# Patient Record
Sex: Female | Born: 1955
Health system: Southern US, Community
[De-identification: ages and names within clinical notes are randomized; demographics above are authoritative.]

## PROBLEM LIST (undated history)

## (undated) DIAGNOSIS — F419 Anxiety disorder, unspecified: Secondary | ICD-10-CM

## (undated) DIAGNOSIS — F32A Depression, unspecified: Secondary | ICD-10-CM

## (undated) DIAGNOSIS — F329 Major depressive disorder, single episode, unspecified: Secondary | ICD-10-CM

## (undated) HISTORY — DX: Major depressive disorder, single episode, unspecified: F32.9

## (undated) HISTORY — DX: Depression, unspecified: F32.A

## (undated) HISTORY — PX: ABDOMINAL HYSTERECTOMY: SHX81

## (undated) HISTORY — PX: TONSILLECTOMY: SUR1361

## (undated) HISTORY — DX: Anxiety disorder, unspecified: F41.9

---

## 2012-07-01 DIAGNOSIS — R350 Frequency of micturition: Secondary | ICD-10-CM | POA: Insufficient documentation

## 2014-05-23 DIAGNOSIS — F1010 Alcohol abuse, uncomplicated: Secondary | ICD-10-CM | POA: Insufficient documentation

## 2014-06-23 ENCOUNTER — Encounter (HOSPITAL_COMMUNITY): Payer: Self-pay | Admitting: Physician Assistant

## 2014-06-23 ENCOUNTER — Encounter (INDEPENDENT_AMBULATORY_CARE_PROVIDER_SITE_OTHER): Payer: Self-pay

## 2014-06-23 ENCOUNTER — Ambulatory Visit (INDEPENDENT_AMBULATORY_CARE_PROVIDER_SITE_OTHER): Payer: BC Managed Care – PPO | Admitting: Physician Assistant

## 2014-06-23 VITALS — BP 159/77 | HR 75 | Ht 65.0 in | Wt 150.0 lb

## 2014-06-23 DIAGNOSIS — M545 Low back pain, unspecified: Secondary | ICD-10-CM | POA: Insufficient documentation

## 2014-06-23 DIAGNOSIS — J309 Allergic rhinitis, unspecified: Secondary | ICD-10-CM | POA: Insufficient documentation

## 2014-06-23 DIAGNOSIS — R945 Abnormal results of liver function studies: Secondary | ICD-10-CM | POA: Insufficient documentation

## 2014-06-23 DIAGNOSIS — I10 Essential (primary) hypertension: Secondary | ICD-10-CM | POA: Insufficient documentation

## 2014-06-23 DIAGNOSIS — E785 Hyperlipidemia, unspecified: Secondary | ICD-10-CM | POA: Insufficient documentation

## 2014-06-23 DIAGNOSIS — F1024 Alcohol dependence with alcohol-induced mood disorder: Secondary | ICD-10-CM

## 2014-06-23 DIAGNOSIS — M5136 Other intervertebral disc degeneration, lumbar region: Secondary | ICD-10-CM | POA: Insufficient documentation

## 2014-06-23 DIAGNOSIS — F419 Anxiety disorder, unspecified: Secondary | ICD-10-CM | POA: Insufficient documentation

## 2014-06-23 DIAGNOSIS — M419 Scoliosis, unspecified: Secondary | ICD-10-CM | POA: Insufficient documentation

## 2014-06-23 DIAGNOSIS — F10239 Alcohol dependence with withdrawal, unspecified: Secondary | ICD-10-CM

## 2014-06-23 DIAGNOSIS — R7989 Other specified abnormal findings of blood chemistry: Secondary | ICD-10-CM | POA: Insufficient documentation

## 2014-06-23 NOTE — Progress Notes (Signed)
Psychiatric Assessment Adult  Patient Identification:  Laren BoomLeslie Ellen Wisniewski Date of Evaluation:  06/23/2014 Chief Complaint: Alcohol abuse History of Chief Complaint:   Chief Complaint  Patient presents with  . Establish Care    HPI Comments: The patient is a 58 year old MWF who [facility] evaluation and treatment of her alcohol abuse by her PCP Dr. Record. In October of this year the patient had drunk 1/5 of rum over a weekend and was found unresponsive by her husband who sent her to the ED where she was found to have a BAL of 404. She was treated with fluids recovered and sent home. She returned to the ED the next day due to tremors and a telepsych was done and her BAL was at 10. She was told to drink 2 beers or glasses of wine per day since then to avoid recurrence of symptoms.   She has been drinking daily for 2 years with her longest period of abstinence in that time was 1 week. She has 2 drinks at night after work, and 3-4 on the weekends.  She has had no previous hx of alcohol or substance abuse.  Her father was an alcoholic and is now deceased. Both of her parents are deceased. She has no previous history of psychiatric issues but is currently on Xanax and Zoloft for her anxiety.  She denies any current symptoms of depression but notes that prior to her recent ED visits she has been thinking more about her deceased parents and feels a lot of guilt about them. She reports her symptoms of depression increased over the past 6-8 months as her drinking increased.  She denies SI/HI or AVH.        Review of Systems Physical Exam  Depressive Symptoms: anhedonia prior to ED  (Hypo) Manic Symptoms:   Elevated Mood:  No Irritable Mood:  No Grandiosity:  No Distractibility:  No Labiality of Mood:  No Delusions:  No Hallucinations:  No Impulsivity:  No Sexually Inappropriate Behavior:  No Financial Extravagance:  No Flight of Ideas:  No  Anxiety  Symptoms: Excessive Worry:  No Panic Symptoms:  No Agoraphobia:  No Obsessive Compulsive: No  Symptoms: None, Specific Phobias:  No Social Anxiety:  No  Psychotic Symptoms:  Hallucinations: No  Delusions:  No Paranoia:  No   Ideas of Reference:  No  PTSD Symptoms: Ever had a traumatic exposure:  No Had a traumatic exposure in the last month:  No Re-experiencing:   Hypervigilance:   Hyperarousal:  Avoidance:    Traumatic Brain Injury: No   Past Psychiatric History: Diagnosis: none  Hospitalizations: none  Outpatient Care: none  Substance Abuse Care: none  Self-Mutilation: none  Suicidal Attempts: none  Violent Behaviors: none   Past Medical History:   Past Medical History  Diagnosis Date  . Anxiety   . Depression    History of Loss of Consciousness:  No Seizure History:  No Cardiac History:  No Allergies:   Allergies  Allergen Reactions  . Sulfa Antibiotics    Current Medications:  Current Outpatient Prescriptions  Medication Sig Dispense Refill  . ALPRAZolam (XANAX) 0.5 MG tablet Take 0.5 mg by mouth.    Marland Kitchen. atorvastatin (LIPITOR) 40 MG tablet TAKE 1/2 TO 1 TABLET DAILY FOR CHOLESTROL    . carvedilol (COREG) 12.5 MG tablet TAKE 1 TABLET BY MOUTH TWICE A DAY FOR BLOOD PRESSURE.    Marland Kitchen. estradiol (VIVELLE-DOT) 0.05 MG/24HR patch Place 1 patch onto  the skin.    . fluticasone (FLONASE) 50 MCG/ACT nasal spray 1 spray in each nostril daily    . HYDROcodone-acetaminophen (NORCO/VICODIN) 5-325 MG per tablet Take 1 tablet by mouth.    Marland Kitchen. lisinopril-hydrochlorothiazide (PRINZIDE,ZESTORETIC) 20-25 MG per tablet TAKE 1 TABLET BY MOUTH EVERY MORNING FOR BLOOD PRESSURE.    . naproxen (NAPROSYN) 375 MG tablet TAKE 1 TABLET (375 MG TOTAL) BY MOUTH 2 (TWO) TIMES DAILY AS NEEDED (BACK PAIN.). TAKE WITH FOOD.    Marland Kitchen. sertraline (ZOLOFT) 100 MG tablet TAKE 1 TABLET EVERY DAY    . montelukast (SINGULAIR) 10 MG tablet TAKE 1 TABLET (10 MG TOTAL) BY MOUTH DAILY. FOR COUGH AND CONGESTION      No current facility-administered medications for this visit.    Previous Psychotropic Medications:  Medication Dose   zoloft  100   xanax                   Substance Abuse History in the last 12 months:  See HPI  Medical Consequences of Substance Abuse: See HPI  Legal Consequences of Substance Abuse: none  Family Consequences of Substance Abuse: alienation of family  Blackouts:  No DT's:  Yes Withdrawal Symptoms:  Yes Cramps Headaches Nausea Tremors  Social History: [facility] [place] Place [place] Birth[place] Family Members: Husband 2 sons, parents and 1 brother deceased, 1 brother alive Marital Status:  Married Children: 2  Sons: 2  Daughters:  Relationships:  Education:  HS Print production plannerGraduate Educational Problems/Performance:  Religious Beliefs/Practices: chrisitian History of Abuse: none Teacher, musicccupational Experiences; Military History:  None. Legal History: None Hobbies/Interests:   Family History:   Family History  Problem Relation Age of Onset  . Alcohol abuse Father   . Schizophrenia Paternal Uncle     Mental Status Examination/Evaluation: Objective:  Appearance: Well Groomed  Eye Contact::  Good  Speech:  Clear and Coherent  Volume:  Normal  Mood:  euthymic  Affect:  Congruent congruent and motivated  Thought Process:  Goal Directed  Orientation:  Full (Time, Place, and Person)  Thought Content:  WDL  Suicidal Thoughts:  No  Homicidal Thoughts:  No  Judgement:  Good  Insight:  Present  Psychomotor Activity:  Normal  Akathisia:  No  Handed:  Right  AIMS (if indicated):    Assets:  Communication Skills Desire for Improvement Financial Resources/Insurance Housing Leisure Time Physical Health Resilience Social Support Talents/Skills Transportation Vocational/Educational    Laboratory/X-Ray Psychological Evaluation(s)        Assessment:  Substance induced mood disorder, Alcohol dependence in early  withdrawal.  AXIS I  SIMDO, Alcohol dependence early withdrawal  AXIS II Deferred  AXIS III Past Medical History  Diagnosis Date  . Anxiety   . Depression      AXIS IV problems related to social environment  AXIS V 61-70 mild symptoms   Treatment Plan/Recommendations:  Plan of Care:  1. Discussed options with patient which included AA meetings, [facility]. (Patient's preference)  Laboratory:  None at this time  Psychotherapy: as needed  Medications: continue as written  Routine PRN Medications:  No  Consultations: as needed  Safety Concerns:  Patient is advised to d/c alcohol by decreasing amount each day by 1/2 every three days to lessen her withdrawal symptoms.  She is advised to call for questions or concerns.  Should she experience any symptoms of alcohol withdrawal she is to go to the nearest ED.  We are happy to manage  her medications if she chooses or she can follow up with her PCP.   Other:      Kamiyah Kindel, PA-C 11/12/20159:25 AM

## 2018-08-27 ENCOUNTER — Ambulatory Visit (INDEPENDENT_AMBULATORY_CARE_PROVIDER_SITE_OTHER): Payer: BLUE CROSS/BLUE SHIELD | Admitting: Osteopathic Medicine

## 2018-08-27 ENCOUNTER — Encounter: Payer: Self-pay | Admitting: Osteopathic Medicine

## 2018-08-27 VITALS — BP 118/53 | HR 81 | Temp 98.2°F | Ht 65.0 in | Wt 151.9 lb

## 2018-08-27 DIAGNOSIS — F17209 Nicotine dependence, unspecified, with unspecified nicotine-induced disorders: Secondary | ICD-10-CM

## 2018-08-27 DIAGNOSIS — E782 Mixed hyperlipidemia: Secondary | ICD-10-CM | POA: Diagnosis not present

## 2018-08-27 DIAGNOSIS — I10 Essential (primary) hypertension: Secondary | ICD-10-CM

## 2018-08-27 DIAGNOSIS — R7989 Other specified abnormal findings of blood chemistry: Secondary | ICD-10-CM

## 2018-08-27 DIAGNOSIS — R945 Abnormal results of liver function studies: Secondary | ICD-10-CM

## 2018-08-27 DIAGNOSIS — Z716 Tobacco abuse counseling: Secondary | ICD-10-CM

## 2018-08-27 DIAGNOSIS — R011 Cardiac murmur, unspecified: Secondary | ICD-10-CM

## 2018-08-27 DIAGNOSIS — M5136 Other intervertebral disc degeneration, lumbar region: Secondary | ICD-10-CM

## 2018-08-27 DIAGNOSIS — F419 Anxiety disorder, unspecified: Secondary | ICD-10-CM

## 2018-08-27 DIAGNOSIS — F1011 Alcohol abuse, in remission: Secondary | ICD-10-CM | POA: Insufficient documentation

## 2018-08-27 MED ORDER — ATORVASTATIN CALCIUM 40 MG PO TABS: 40.0000 mg | ORAL_TABLET | Freq: Every day | ORAL | 3 refills | Status: DC

## 2018-08-27 MED ORDER — TRAZODONE HCL 50 MG PO TABS: 50.0000 mg | ORAL_TABLET | Freq: Every day | ORAL | 3 refills | Status: AC

## 2018-08-27 MED ORDER — GABAPENTIN 100 MG PO CAPS: 100.0000 mg | ORAL_CAPSULE | Freq: Three times a day (TID) | ORAL | 3 refills | Status: AC

## 2018-08-27 MED ORDER — LISINOPRIL 20 MG PO TABS: 20.0000 mg | ORAL_TABLET | Freq: Every day | ORAL | 3 refills | Status: DC

## 2018-08-27 MED ORDER — NALTREXONE HCL 50 MG PO TABS: 50.0000 mg | ORAL_TABLET | Freq: Every day | ORAL | 3 refills | Status: DC

## 2018-08-27 MED ORDER — SERTRALINE HCL 100 MG PO TABS: 100.0000 mg | ORAL_TABLET | Freq: Every day | ORAL | 3 refills | Status: DC

## 2018-08-27 MED ORDER — MECLIZINE HCL 25 MG PO TABS: 25.0000 mg | ORAL_TABLET | Freq: Two times a day (BID) | ORAL | 3 refills | Status: DC

## 2018-08-27 MED ORDER — CARVEDILOL 25 MG PO TABS: 25.0000 mg | ORAL_TABLET | Freq: Two times a day (BID) | ORAL | 3 refills | Status: DC

## 2018-08-27 MED ORDER — ESTRADIOL 0.05 MG/24HR TD PTTW: 1.0000 | MEDICATED_PATCH | TRANSDERMAL | 3 refills | Status: DC

## 2018-08-27 NOTE — Patient Instructions (Signed)
Plan: Lab orders in, get done fasting when due Depending on lab results, may consider ultrasound of the liver Will get ultrasound of the heart to evaluate mild murmur Have your pharmacy contact us when you need refills  Will see you back in about 6 months for regular follow-up and annual physical I'm here is you need me sooner!

## 2018-08-27 NOTE — Progress Notes (Signed)
HPI: Michelle Hughes is a 63 y.o. female who  has a past medical history of Anxiety and Depression.  she presents to [facility] today, 08/27/18,  for chief complaint of: New to establish - see headings below for details   CARDIOVASCULAR  HTN: no CP/SOB  HLD: on statin  RESPIRATORY  Per records: "Tobacco dependence:has prev been able tostop, still 1/2 ppd" confirmed w/ pt  NEUROLOGICAL/PSYCHIATRIC  Hx EtOH absuse/dependence uncontrolled for about 3-4 years total - currently in therapy and in remission though still consuming 1-2 glasses of wine per week.   Sertaline for many years  Previously on Ativan 0.5 mg po q8h prn  RENAL  Per records: Hx of hyponatremia: her sodium drops when she is intoxicated for longer periods of time. Normalizes when she stops drinking. Her sertraline (sometimes assoc with SIADH) has not changed since 2013."  URINARY  REPRODUCTIVE  Per records: "Menopause:surgical after severe endometritis and PID in her 20's. On estrogen since then, has stopped it periodically and states does not notice any difference when she does. Is using the estradiol patch per GYN."  GASTROINTESTINAL  Fatty liver, EtOH use, hx elevated liver enzymes and elevated lipase    Labs reviewed:  03/2018: CBC ok, CMP showed leevated AST to 146 and ALT to 72 about baseline, chronically elevated Lipase 99 at last check, slight elevated Ca at 10.7, Sodium ok,          Past medical, surgical, social and family history reviewed:  Patient Active Problem List   Diagnosis Date Noted  . Allergic rhinitis 06/23/2014  . Anxiety 06/23/2014  . Abnormal LFTs 06/23/2014  . HLD (hyperlipidemia) 06/23/2014  . BP (high blood pressure) 06/23/2014  . LBP (low back pain) 06/23/2014  . DDD (degenerative disc disease), lumbar 06/23/2014  . Scoliosis 06/23/2014  . AA (alcohol abuse) 05/23/2014  . FOM (frequency of micturition) 07/01/2012    No  past surgical history on file.  Social History   Tobacco Use  . Smoking status: Current Every Day Smoker    Packs/day: 0.50    Years: 40.00    Pack years: 20.00    Types: Cigarettes  . Smokeless tobacco: Never Used  Substance Use Topics  . Alcohol use: Yes    Alcohol/week: 0.0 standard drinks    Comment: 18 servings a week. Now 1-2 glasses per night    Family History  Problem Relation Age of Onset  . Alcohol abuse Father   . Schizophrenia Paternal Uncle      Current medication list and allergy/intolerance information reviewed:    Current Outpatient Medications  Medication Sig Dispense Refill  . ALPRAZolam (XANAX) 0.5 MG tablet Take 0.5 mg by mouth.    Marland Kitchen atorvastatin (LIPITOR) 40 MG tablet TAKE 1/2 TO 1 TABLET DAILY FOR CHOLESTROL    . carvedilol (COREG) 12.5 MG tablet TAKE 1 TABLET BY MOUTH TWICE A DAY FOR BLOOD PRESSURE.    Marland Kitchen estradiol (VIVELLE-DOT) 0.05 MG/24HR patch Place 1 patch onto the skin.    . fluticasone (FLONASE) 50 MCG/ACT nasal spray 1 spray in each nostril daily    . HYDROcodone-acetaminophen (NORCO/VICODIN) 5-325 MG per tablet Take 1 tablet by mouth.    Marland Kitchen lisinopril-hydrochlorothiazide (PRINZIDE,ZESTORETIC) 20-25 MG per tablet TAKE 1 TABLET BY MOUTH EVERY MORNING FOR BLOOD PRESSURE.    . montelukast (SINGULAIR) 10 MG tablet TAKE 1 TABLET (10 MG TOTAL) BY MOUTH DAILY. FOR COUGH AND CONGESTION    . naproxen (NAPROSYN) 375 MG tablet  TAKE 1 TABLET (375 MG TOTAL) BY MOUTH 2 (TWO) TIMES DAILY AS NEEDED (BACK PAIN.). TAKE WITH FOOD.    Marland Kitchen. sertraline (ZOLOFT) 100 MG tablet TAKE 1 TABLET EVERY DAY     No current facility-administered medications for this visit.     Allergies  Allergen Reactions  . Sulfa Antibiotics       Review of Systems:  Constitutional:  No  fever, no chills, No recent illness, No unintentional weight changes. No significant fatigue.   HEENT: No  headache, no vision change, no hearing change, No sore throat, No  sinus pressure  Cardiac: No   chest pain, No  pressure, No palpitations, No  Orthopnea  Respiratory:  No  shortness of breath. No  Cough  Gastrointestinal: No  abdominal pain, No  nausea, No  vomiting,  No  blood in stool, No  diarrhea, No  constipation   Musculoskeletal: No new myalgia/arthralgia  Skin: No  Rash, No other wounds/concerning lesions  Genitourinary: No  incontinence, No  abnormal genital bleeding, No abnormal genital discharge  Hem/Onc: No  easy bruising/bleeding, No  abnormal lymph node  Endocrine: No cold intolerance,  No heat intolerance. No polyuria/polydipsia/polyphagia   Neurologic: No  weakness, No  dizziness, No  slurred speech/focal weakness/facial droop  Psychiatric: No  concerns with depression, No  concerns with anxiety, No sleep problems, No mood problems  Exam:  BP (!) 118/53 (BP Location: Left Arm, Patient Position: Sitting, Cuff Size: Normal)   Pulse 81   Temp 98.2 F (36.8 C) (Oral)   Ht 5\' 5"  (1.651 m)   Wt 151 lb 14.4 oz (68.9 kg)   BMI 25.28 kg/m   Constitutional: VS see above. General Appearance: alert, well-developed, well-nourished, NAD  Eyes: Normal lids and conjunctive, non-icteric sclera  Ears, Nose, Mouth, Throat: MMM, Normal external inspection ears/nares/mouth/lips/gums. TM normal bilaterally. Pharynx/tonsils no erythema, no exudate. Nasal mucosa normal.   Neck: No masses, trachea midline. No thyroid enlargement. No tenderness/mass appreciated. No lymphadenopathy  Respiratory: Normal respiratory effort. no wheeze, no rhonchi, no rales  Cardiovascular: S1/S2 normal, +murmur, no rub/gallop auscultated. RRR. No lower extremity edema.  Gastrointestinal: Nontender, no masses. No hepatomegaly, no splenomegaly. No hernia appreciated. Bowel sounds normal. Rectal exam deferred.   Musculoskeletal: Gait normal. No clubbing/cyanosis of digits.   Neurological: Normal balance/coordination. No tremor. No cranial nerve deficit on limited exam. Motor and sensation intact  and symmetric. Cerebellar reflexes intact.   Skin: warm, dry, intact. No rash/ulcer. No concerning nevi or subq nodules on limited exam.    Psychiatric: Normal judgment/insight. Normal mood and affect. Oriented x3.        ASSESSMENT/PLAN: The primary encounter diagnosis was Abnormal LFTs. Diagnoses of Tobacco use disorder, continuous, Tobacco abuse counseling, Mixed hyperlipidemia, DDD (degenerative disc disease), lumbar, Essential hypertension, Anxiety, Alcohol abuse, in remission, and Heart murmur were also pertinent to this visit.   Orders Placed This Encounter  Procedures  . CBC  . COMPLETE METABOLIC PANEL WITH GFR  . Lipid panel  . Lipase  . TSH  . ECHOCARDIOGRAM COMPLETE    Meds ordered this encounter  Medications  . atorvastatin (LIPITOR) 40 MG tablet    Sig: Take 1 tablet (40 mg total) by mouth daily.    Dispense:  90 tablet    Refill:  3  . carvedilol (COREG) 25 MG tablet    Sig: Take 1 tablet (25 mg total) by mouth 2 (two) times daily with a meal.    Dispense:  180 tablet  Refill:  3  . estradiol (VIVELLE-DOT) 0.05 MG/24HR patch    Sig: Place 1 patch (0.05 mg total) onto the skin 2 (two) times a week.    Dispense:  24 patch    Refill:  3  . gabapentin (NEURONTIN) 100 MG capsule    Sig: Take 1 capsule (100 mg total) by mouth 3 (three) times daily.    Dispense:  270 capsule    Refill:  3  . lisinopril (PRINIVIL,ZESTRIL) 20 MG tablet    Sig: Take 1 tablet (20 mg total) by mouth daily.    Dispense:  90 tablet    Refill:  3  . meclizine (ANTIVERT) 25 MG tablet    Sig: Take 1 tablet (25 mg total) by mouth 2 (two) times daily.    Dispense:  180 tablet    Refill:  3  . naltrexone (DEPADE) 50 MG tablet    Sig: Take 1 tablet (50 mg total) by mouth daily.    Dispense:  90 tablet    Refill:  3  . sertraline (ZOLOFT) 100 MG tablet    Sig: Take 1 tablet (100 mg total) by mouth daily.    Dispense:  90 tablet    Refill:  3  . traZODone (DESYREL) 50 MG tablet     Sig: Take 1 tablet (50 mg total) by mouth at bedtime.    Dispense:  90 tablet    Refill:  3    Patient Instructions  Plan: Lab orders in, get done fasting when due Depending on lab results, may consider ultrasound of the liver Will get ultrasound of the heart to evaluate mild murmur Have your pharmacy contact us when you need refills  Will see you back in about 6 months for regular follow-up and annual physical I'm here is you need me sooner!           Visit summary with medication list and pertinent instructions was printed for patient to review. All questions at time of visit were answered - patient instructed to contact office with any additional concerns or updates. ER/RTC precautions were reviewed with the patient.     Please note: voice recognition software was used to produce this document, and typos may escape review. Please contact Dr. Lyn HollingsheadAlexander for any needed clarifications.     Follow-up plan: Return in about 6 months (around 02/25/2019) for ANNUAL PHYSICAL, MONITOR BLOOD PRESSURE. Sooner if needed. .Marland Kitchen

## 2018-08-28 ENCOUNTER — Encounter: Payer: Self-pay | Admitting: Osteopathic Medicine

## 2018-09-08 ENCOUNTER — Ambulatory Visit (HOSPITAL_BASED_OUTPATIENT_CLINIC_OR_DEPARTMENT_OTHER): Admission: RE | Admit: 2018-09-08 | Payer: BLUE CROSS/BLUE SHIELD | Source: Ambulatory Visit

## 2018-09-08 ENCOUNTER — Encounter (HOSPITAL_BASED_OUTPATIENT_CLINIC_OR_DEPARTMENT_OTHER): Payer: Self-pay

## 2018-11-02 ENCOUNTER — Other Ambulatory Visit: Payer: Self-pay | Admitting: Physician Assistant

## 2018-11-02 MED ORDER — ATORVASTATIN CALCIUM 40 MG PO TABS: 40.0000 mg | ORAL_TABLET | Freq: Every day | ORAL | 1 refills | Status: DC

## 2018-11-02 MED ORDER — NALTREXONE HCL 50 MG PO TABS: 50.0000 mg | ORAL_TABLET | Freq: Every day | ORAL | 1 refills | Status: DC

## 2018-11-02 MED ORDER — ESTRADIOL 0.05 MG/24HR TD PTTW: 1.0000 | MEDICATED_PATCH | TRANSDERMAL | 1 refills | Status: DC

## 2019-01-20 ENCOUNTER — Telehealth: Payer: Self-pay | Admitting: Osteopathic Medicine

## 2019-01-20 MED ORDER — MECLIZINE HCL 25 MG PO TABS: 25.0000 mg | ORAL_TABLET | Freq: Two times a day (BID) | ORAL | 0 refills | Status: DC

## 2019-01-20 NOTE — Telephone Encounter (Signed)
Left brief VM for patient that PCP has sent in the prescription to the pharmacy. Patient was asked to call back with any questions.

## 2019-01-20 NOTE — Telephone Encounter (Signed)
Patient had one dizzy spell last week when she first got up and it went away after 5 minutes. She is out of her Meclizine and wanted to know if she could have a refill. She has not had another dizzy spell since that one. She has a follow up on 03/03/2019. I offered her a visit and she declined at this time. I advised her if she had more to call the office so she could be evaluated.  Please advise.

## 2019-01-20 NOTE — Telephone Encounter (Signed)
Fine to refill the meclizine if this is a chronic issue but yes if it is something that is worsening she would definitely need to be evaluated, okay to try virtual visit first if needed

## 2019-03-03 ENCOUNTER — Encounter: Payer: Self-pay | Admitting: Osteopathic Medicine

## 2019-03-28 ENCOUNTER — Other Ambulatory Visit: Payer: Self-pay | Admitting: Osteopathic Medicine

## 2019-03-29 NOTE — Telephone Encounter (Signed)
Needs appt with PcP.

## 2019-04-09 DIAGNOSIS — I10 Essential (primary) hypertension: Secondary | ICD-10-CM | POA: Diagnosis not present

## 2019-04-09 DIAGNOSIS — R945 Abnormal results of liver function studies: Secondary | ICD-10-CM | POA: Diagnosis not present

## 2019-04-09 DIAGNOSIS — F17209 Nicotine dependence, unspecified, with unspecified nicotine-induced disorders: Secondary | ICD-10-CM | POA: Diagnosis not present

## 2019-04-09 DIAGNOSIS — E782 Mixed hyperlipidemia: Secondary | ICD-10-CM | POA: Diagnosis not present

## 2019-04-09 DIAGNOSIS — D649 Anemia, unspecified: Secondary | ICD-10-CM | POA: Diagnosis not present

## 2019-04-09 DIAGNOSIS — F419 Anxiety disorder, unspecified: Secondary | ICD-10-CM | POA: Diagnosis not present

## 2019-04-10 ENCOUNTER — Other Ambulatory Visit: Payer: Self-pay | Admitting: Osteopathic Medicine

## 2019-04-10 DIAGNOSIS — D649 Anemia, unspecified: Secondary | ICD-10-CM

## 2019-04-10 DIAGNOSIS — R7989 Other specified abnormal findings of blood chemistry: Secondary | ICD-10-CM

## 2019-04-15 LAB — COMPLETE METABOLIC PANEL WITH GFR
AG Ratio: 1.7 (calc) (ref 1.0–2.5)
ALT: 45 U/L — ABNORMAL HIGH (ref 6–29)
AST: 94 U/L — ABNORMAL HIGH (ref 10–35)
Albumin: 4.3 g/dL (ref 3.6–5.1)
Alkaline phosphatase (APISO): 63 U/L (ref 37–153)
BUN: 9 mg/dL (ref 7–25)
CO2: 28 mmol/L (ref 20–32)
Calcium: 9.2 mg/dL (ref 8.6–10.4)
Chloride: 99 mmol/L (ref 98–110)
Creat: 0.73 mg/dL (ref 0.50–0.99)
GFR, Est African American: 102 mL/min/{1.73_m2} (ref >=60)
GFR, Est Non African American: 88 mL/min/{1.73_m2} (ref >=60)
Globulin: 2.6 g/dL (calc) (ref 1.9–3.7)
Glucose, Bld: 93 mg/dL (ref 65–139)
Potassium: 4.1 mmol/L (ref 3.5–5.3)
Sodium: 136 mmol/L (ref 135–146)
Total Bilirubin: 0.4 mg/dL (ref 0.2–1.2)
Total Protein: 6.9 g/dL (ref 6.1–8.1)

## 2019-04-15 LAB — CBC
HCT: 32.5 % — ABNORMAL LOW (ref 35.0–45.0)
Hemoglobin: 9.4 g/dL — ABNORMAL LOW (ref 11.7–15.5)
MCH: 23.5 pg — ABNORMAL LOW (ref 27.0–33.0)
MCHC: 28.9 g/dL — ABNORMAL LOW (ref 32.0–36.0)
MCV: 81.3 fL (ref 80.0–100.0)
MPV: 10.3 fL (ref 7.5–12.5)
Platelets: 332 10*3/uL (ref 140–400)
RBC: 4 10*6/uL (ref 3.80–5.10)
RDW: 18.2 % — ABNORMAL HIGH (ref 11.0–15.0)
WBC: 5.5 10*3/uL (ref 3.8–10.8)

## 2019-04-15 LAB — TEST AUTHORIZATION

## 2019-04-15 LAB — LIPID PANEL
Cholesterol: 168 mg/dL (ref <200)
HDL: 74 mg/dL (ref >=50)
LDL Cholesterol (Calc): 72 mg/dL (calc)
Non-HDL Cholesterol (Calc): 94 mg/dL (calc) (ref <130)
Total CHOL/HDL Ratio: 2.3 (calc) (ref <5.0)
Triglycerides: 140 mg/dL (ref <150)

## 2019-04-15 LAB — LIPASE: Lipase: 112 U/L — ABNORMAL HIGH (ref 7–60)

## 2019-04-15 LAB — IRON,TIBC AND FERRITIN PANEL
%SAT: 5 % (calc) — ABNORMAL LOW (ref 16–45)
Ferritin: 16 ng/mL (ref 16–288)
Iron: 20 ug/dL — ABNORMAL LOW (ref 45–160)
TIBC: 441 mcg/dL (calc) (ref 250–450)

## 2019-04-15 LAB — TSH: TSH: 0.39 mIU/L — ABNORMAL LOW (ref 0.40–4.50)

## 2019-04-15 LAB — T4, FREE: Free T4: 1 ng/dL (ref 0.8–1.8)

## 2019-04-21 ENCOUNTER — Ambulatory Visit (INDEPENDENT_AMBULATORY_CARE_PROVIDER_SITE_OTHER): Payer: BC Managed Care – PPO | Admitting: Osteopathic Medicine

## 2019-04-21 ENCOUNTER — Encounter: Payer: Self-pay | Admitting: Osteopathic Medicine

## 2019-04-21 VITALS — BP 119/53 | Wt 148.0 lb

## 2019-04-21 DIAGNOSIS — Z0000 Encounter for general adult medical examination without abnormal findings: Secondary | ICD-10-CM

## 2019-04-21 DIAGNOSIS — E0590 Thyrotoxicosis, unspecified without thyrotoxic crisis or storm: Secondary | ICD-10-CM

## 2019-04-21 DIAGNOSIS — E782 Mixed hyperlipidemia: Secondary | ICD-10-CM

## 2019-04-21 DIAGNOSIS — Z0001 Encounter for general adult medical examination with abnormal findings: Secondary | ICD-10-CM

## 2019-04-21 DIAGNOSIS — F411 Generalized anxiety disorder: Secondary | ICD-10-CM | POA: Diagnosis not present

## 2019-04-21 DIAGNOSIS — R945 Abnormal results of liver function studies: Secondary | ICD-10-CM

## 2019-04-21 DIAGNOSIS — D509 Iron deficiency anemia, unspecified: Secondary | ICD-10-CM

## 2019-04-21 DIAGNOSIS — R7989 Other specified abnormal findings of blood chemistry: Secondary | ICD-10-CM

## 2019-04-21 DIAGNOSIS — F17209 Nicotine dependence, unspecified, with unspecified nicotine-induced disorders: Secondary | ICD-10-CM

## 2019-04-21 MED ORDER — ZOSTER VAC RECOMB ADJUVANTED 50 MCG/0.5ML IM SUSR: 0.5000 mL | Freq: Once | INTRAMUSCULAR | 1 refills | Status: AC

## 2019-04-21 MED ORDER — FERROUS SULFATE 325 (65 FE) MG PO TABS: 325.0000 mg | ORAL_TABLET | Freq: Two times a day (BID) | ORAL | 1 refills | Status: DC

## 2019-04-21 MED ORDER — ESCITALOPRAM OXALATE 20 MG PO TABS: ORAL_TABLET | ORAL | 1 refills | Status: DC

## 2019-04-21 NOTE — Progress Notes (Signed)
Virtual Visit via Phone   I reached out to      Michelle BoomLeslie Ellen Cremeens on 04/21/19 at 9:50 AM by a telemedicine application [employer], no answer until 9:55 when I called phone, at that time verified that I am speaking with the correct person using two identifiers.  Patient is at home  I am in office   I discussed the limitations of evaluation and management by telemedicine and the availability of in person appointments. The patient expressed understanding and agreed to proceed.  History of Present Illness: Michelle Hughes is a 63 y.o. female who would like to discuss physical      Patient here for annual physical / wellness exam.  See preventive care reviewed as below.  Recent labs reviewed in detail with the patient. See A/P  Additional concerns today include:   Anxiety: Has been on Zoloft for years, doesn't seem to be helping anymore. Has been on Wellbutrin in the past for smoking cessation and this was not tolerated.         Observations/Objective: BP (!) 119/53   Wt 148 lb (67.1 kg)   BMI 24.63 kg/m  BP Readings from Last 3 Encounters:  04/21/19 (!) 119/53  08/27/18 (!) 118/53   Exam: Normal Speech.    Lab and Radiology Results No results found for this or any previous visit (from the past 72 hour(s)). No results found.  Recent Results (from the past 2160 hour(s))  CBC     Status: Abnormal   Collection Time: 04/09/19  9:49 AM  Result Value Ref Range   WBC 5.5 3.8 - 10.8 Thousand/uL   RBC 4.00 3.80 - 5.10 Million/uL   Hemoglobin 9.4 (L) 11.7 - 15.5 g/dL   HCT 65.732.5 (L) 84.635.0 - 96.245.0 %   MCV 81.3 80.0 - 100.0 fL   MCH 23.5 (L) 27.0 - 33.0 pg   MCHC 28.9 (L) 32.0 - 36.0 g/dL   RDW 95.218.2 (H) 84.111.0 - 32.415.0 %   Platelets 332 140 - 400 Thousand/uL   MPV 10.3 7.5 - 12.5 fL  COMPLETE METABOLIC PANEL WITH GFR     Status: Abnormal   Collection Time: 04/09/19  9:49 AM  Result Value Ref Range   Glucose, Bld 93 65 - 139 mg/dL    Comment: .        Non-fasting  reference interval .    BUN 9 7 - 25 mg/dL   Creat 4.010.73 0.270.50 - 2.530.99 mg/dL    Comment: For patients >63 years of age, the reference limit for Creatinine is approximately 13% higher for people identified as African-American. .    GFR, Est Non African American 88 > OR = 60 mL/min/1.2873m2   GFR, Est African American 102 > OR = 60 mL/min/1.3373m2   BUN/Creatinine Ratio NOT APPLICABLE 6 - 22 (calc)   Sodium 136 135 - 146 mmol/L   Potassium 4.1 3.5 - 5.3 mmol/L   Chloride 99 98 - 110 mmol/L   CO2 28 20 - 32 mmol/L   Calcium 9.2 8.6 - 10.4 mg/dL   Total Protein 6.9 6.1 - 8.1 g/dL   Albumin 4.3 3.6 - 5.1 g/dL   Globulin 2.6 1.9 - 3.7 g/dL (calc)   AG Ratio 1.7 1.0 - 2.5 (calc)   Total Bilirubin 0.4 0.2 - 1.2 mg/dL   Alkaline phosphatase (APISO) 63 37 - 153 U/L   AST 94 (H) 10 - 35 U/L   ALT 45 (H) 6 - 29 U/L  Lipid  panel     Status: None   Collection Time: 04/09/19  9:49 AM  Result Value Ref Range   Cholesterol 168 <200 mg/dL   HDL 74 > OR = 50 mg/dL   Triglycerides 140 <150 mg/dL   LDL Cholesterol (Calc) 72 mg/dL (calc)    Comment: Reference range: <100 . Desirable range <100 mg/dL for primary prevention;   <70 mg/dL for patients with CHD or diabetic patients  with > or = 2 CHD risk factors. Marland Kitchen LDL-C is now calculated using the Martin-Hopkins  calculation, which is a validated novel method providing  better accuracy than the Friedewald equation in the  estimation of LDL-C.  Cresenciano Genre et al. Annamaria Helling. 3329;518(84): 2061-2068  (http://education.[facility].com/faq/FAQ164)    Total CHOL/HDL Ratio 2.3 <5.0 (calc)   Non-HDL Cholesterol (Calc) 94 <130 mg/dL (calc)    Comment: For patients with diabetes plus 1 major ASCVD risk  factor, treating to a non-HDL-C goal of <100 mg/dL  (LDL-C of <70 mg/dL) is considered a therapeutic  option.   Lipase     Status: Abnormal   Collection Time: 04/09/19  9:49 AM  Result Value Ref Range   Lipase 112 (H) 7 - 60 U/L  TSH     Status: Abnormal    Collection Time: 04/09/19  9:49 AM  Result Value Ref Range   TSH 0.39 (L) 0.40 - 4.50 mIU/L  Iron, TIBC and Ferritin Panel     Status: Abnormal   Collection Time: 04/09/19  9:49 AM  Result Value Ref Range   Iron 20 (L) 45 - 160 mcg/dL   TIBC 441 250 - 450 mcg/dL (calc)   %SAT 5 (L) 16 - 45 % (calc)   Ferritin 16 16 - 288 ng/mL  T4, free     Status: None   Collection Time: 04/09/19  9:49 AM  Result Value Ref Range   Free T4 1.0 0.8 - 1.8 ng/dL  TEST AUTHORIZATION     Status: None   Collection Time: 04/09/19  9:49 AM  Result Value Ref Range   TEST NAME: IRON, TIBC AND FERRITIN PANEL    TEST CODE: [id number]    CLIENT CONTACT: VANICIA H WNGLN    REPORT ALWAYS MESSAGE SIGNATURE      Comment: . The laboratory testing on this patient was verbally requested or confirmed by the ordering physician or his or her authorized representative after contact with an employee of [employer]. Federal regulations require that we maintain on file written authorization for all laboratory testing.  Accordingly we are asking that the ordering physician or his or her authorized representative sign a copy of this report and promptly return it to the client service representative. . . Signature:____________________________________________________ . Please fax this signed page to [phone] or return it via your [employer] courier.     Past Surgical History:  Procedure Laterality Date  . ABDOMINAL HYSTERECTOMY    . TONSILLECTOMY        Assessment and Plan: 63 y.o. female with The primary encounter diagnosis was Annual physical exam. Diagnoses of GAD (generalized anxiety disorder), Iron deficiency anemia, unspecified iron deficiency anemia type, Mixed hyperlipidemia, Tobacco use disorder, continuous, Abnormal LFTs, and Subclinical hyperthyroidism were also pertinent to this visit.   Abnormal labs: Elevated liver enzymes 03/2019, to be improved from levels 03/2018.  Iron  deficiency anemia 03/2019, normal CBC 03/2018 Elevated lipase 03/2019, previous measurement 99 in 03/2018 Subclinical hyperthyroidism. New    PDMP not reviewed this encounter. Orders Placed This  Encounter  Procedures  . MM 3D SCREEN BREAST BILATERAL    Order Specific Question:   Reason for Exam (SYMPTOM  OR DIAGNOSIS REQUIRED)    Answer:   screen    Order Specific Question:   Preferred imaging location?    Answer:   [facility]  . US ABDOMEN LIMITED RUQ    Order Specific Question:   Reason for Exam (SYMPTOM  OR DIAGNOSIS REQUIRED)    Answer:   ABNORMAL LIVER ENZYMES    Order Specific Question:   Preferred imaging location?    Answer:   [facility]  . COMPLETE METABOLIC PANEL WITH GFR  . CBC  . Fe+TIBC+Fer  . TSH  . T4, free   Meds ordered this encounter  Medications  . escitalopram (LEXAPRO) 20 MG tablet    Sig: One half tab daily for a week then one tab by mouth daily    Dispense:  90 tablet    Refill:  1  . Zoster Vaccine Adjuvanted [facility]) injection    Sig: Inject 0.5 mLs into the muscle once for 1 dose. Repeat in 2-6 months. Please fax confirmation of vaccination to Dr Lyn Hollingshead [phone]    Dispense:  0.5 mL    Refill:  1  . ferrous sulfate 325 (65 FE) MG tablet    Sig: Take 1 tablet (325 mg total) by mouth 2 (two) times daily with a meal.    Dispense:  180 tablet    Refill:  1   Patient Instructions  General Preventive Care  Most recent routine screening lipids/other labs:  Already done  Liver enzymes a bit better but still elevated - will get ultrasound   Lipase / pancreatic enzyme chronically elevated, but stable   Iron deficiency anemia, new problem. Iron started, will recheck  Subclinical Hypothyroid based on labs, unlikely to be an issue but weill recheck   Everyone should have blood pressure checked once per year.   Tobacco: don't!   Alcohol: responsible moderation is ok for most adults - if you have concerns about your  alcohol intake, please talk to me!   Exercise: as tolerated to reduce risk of cardiovascular disease and diabetes. Strength training will also prevent osteoporosis.   Mental health: if need for mental health care (medicines, counseling, other), or concerns about moods, please let me know!   Sexual health: if need for STD testing, or if concerns with libido/pain problems, please let me know!  Advanced Directive: Living Will and/or Healthcare Power of Attorney recommended for all adults, regardless of age or health.  Vaccines  Flu vaccine: recommended for almost everyone, every fall.   Shingles vaccine: Shingrix recommended after age 34. Rx sent to pharmacy   Pneumonia vaccines: Prevnar and Pneumovax recommended after age 71  Tetanus booster: Tdap recommended every 10 years. You're good until 2027.    Cancer screenings   Colon cancer screening: recommended for everyone at age 13, but some folks need a colonoscopy sooner if risk factors   Breast cancer screening: mammogram recommended annually after age 29. Last one done 03/2018 was ok. Due for follow-up.   Cervical cancer screening: Can stop at age 72 or w/ hysterectomy.   Lung cancer screening: smoking less than 30-pack-year, no screening needed.  Infection screenings . HIV: recommended screening at least once age 76-65, more often as needed. . Gonorrhea/Chlamydia: screening as needed . Hepatitis C: recommended for anyone born 60-1965 . TB: certain at-risk populations, or depending on work requirements and/or travel history Other .  Bone Density Test: recommended for women at age 63, sooner after age 63 if smoker      Instructions sent via MyChart. If MyChart not available, pt was given option for info via personal e-mail w/ no guarantee of protected health info over unsecured e-mail communication, and MyChart sign-up instructions were included.   Follow Up Instructions: Return in about 6 weeks (around 06/02/2019) for w/  labs prior to visit (orders are in), follow-up medication change from zoloft to lexapo .    I discussed the assessment and treatment plan with the patient. The patient was provided an opportunity to ask questions and all were answered. The patient agreed with the plan and demonstrated an understanding of the instructions.   The patient was advised to call back or seek an in-person evaluation if any new concerns, if symptoms worsen or if the condition fails to improve as anticipated.                        Historical information moved to improve visibility of documentation.  Past Medical History:  Diagnosis Date  . Anxiety   . Depression    Past Surgical History:  Procedure Laterality Date  . ABDOMINAL HYSTERECTOMY    . TONSILLECTOMY     Social History   Tobacco Use  . Smoking status: Current Every Day Smoker    Packs/day: 0.50    Years: 40.00    Pack years: 20.00    Types: Cigarettes  . Smokeless tobacco: Never Used  Substance Use Topics  . Alcohol use: Yes    Alcohol/week: 0.0 standard drinks    Comment: 18 servings a week. Now 1-2 glasses per night   family history includes Alcohol abuse in her father; Schizophrenia in her paternal uncle.  Medications: Current Outpatient Medications  Medication Sig Dispense Refill  . atorvastatin (LIPITOR) 40 MG tablet Take 1 tablet (40 mg total) by mouth daily. 90 tablet 1  . carvedilol (COREG) 25 MG tablet Take 1 tablet (25 mg total) by mouth 2 (two) times daily with a meal. 180 tablet 3  . estradiol (VIVELLE-DOT) 0.05 MG/24HR patch Place 1 patch (0.05 mg total) onto the skin 2 (two) times a week. 24 patch 1  . ferrous sulfate 325 (65 FE) MG tablet Take 1 tablet (325 mg total) by mouth 2 (two) times daily with a meal. 180 tablet 1  . gabapentin (NEURONTIN) 100 MG capsule Take 1 capsule (100 mg total) by mouth 3 (three) times daily. 270 capsule 3  . HYDROcodone-acetaminophen (NORCO/VICODIN) 5-325 MG tablet Take by  mouth.    Marland Kitchen. lisinopril (PRINIVIL,ZESTRIL) 20 MG tablet Take 1 tablet (20 mg total) by mouth daily. 90 tablet 3  . meclizine (ANTIVERT) 25 MG tablet TAKE 1 TABLET BY MOUTH TWICE A DAY 60 tablet 0  . naltrexone (DEPADE) 50 MG tablet Take 1 tablet (50 mg total) by mouth daily. 90 tablet 1  . traZODone (DESYREL) 50 MG tablet Take 1 tablet (50 mg total) by mouth at bedtime. 90 tablet 3  . albuterol (PROVENTIL HFA;VENTOLIN HFA) 108 (90 Base) MCG/ACT inhaler Inhale into the lungs.    Marland Kitchen. escitalopram (LEXAPRO) 20 MG tablet One half tab daily for a week then one tab by mouth daily 90 tablet 1  . fluticasone (FLONASE) 50 MCG/ACT nasal spray 1 spray in each nostril daily    . Influenza Vac Subunit Quad (FLUCELVAX QUADRIVALENT) 0.5 ML SUSY TO BE ADMINISTERED BY PHARMACIST FOR IMMUNIZATION    .  methocarbamol (ROBAXIN) 500 MG tablet Take by mouth.    . naproxen (NAPROSYN) 375 MG tablet TAKE 1 TABLET (375 MG TOTAL) BY MOUTH 2 (TWO) TIMES DAILY AS NEEDED (BACK PAIN.). TAKE WITH FOOD.    Marland Kitchen Zoster Vaccine Adjuvanted [facility]) injection Inject 0.5 mLs into the muscle once for 1 dose. Repeat in 2-6 months. Please fax confirmation of vaccination to Dr Lyn Hollingshead [phone] 0.5 mL 1   No current facility-administered medications for this visit.    Allergies  Allergen Reactions  . Sulfa Antibiotics Rash  . Sulfasalazine     PDMP not reviewed this encounter. Orders Placed This Encounter  Procedures  . MM 3D SCREEN BREAST BILATERAL    Order Specific Question:   Reason for Exam (SYMPTOM  OR DIAGNOSIS REQUIRED)    Answer:   screen    Order Specific Question:   Preferred imaging location?    Answer:   [facility]  . US ABDOMEN LIMITED RUQ    Order Specific Question:   Reason for Exam (SYMPTOM  OR DIAGNOSIS REQUIRED)    Answer:   ABNORMAL LIVER ENZYMES    Order Specific Question:   Preferred imaging location?    Answer:   [facility]  . COMPLETE METABOLIC PANEL WITH GFR  . CBC  .  Fe+TIBC+Fer  . TSH  . T4, free   Meds ordered this encounter  Medications  . escitalopram (LEXAPRO) 20 MG tablet    Sig: One half tab daily for a week then one tab by mouth daily    Dispense:  90 tablet    Refill:  1  . Zoster Vaccine Adjuvanted [facility]) injection    Sig: Inject 0.5 mLs into the muscle once for 1 dose. Repeat in 2-6 months. Please fax confirmation of vaccination to Dr Lyn Hollingshead [phone]    Dispense:  0.5 mL    Refill:  1  . ferrous sulfate 325 (65 FE) MG tablet    Sig: Take 1 tablet (325 mg total) by mouth 2 (two) times daily with a meal.    Dispense:  180 tablet    Refill:  1

## 2019-04-21 NOTE — Patient Instructions (Addendum)
General Preventive Care  Most recent routine screening lipids/other labs:  Already done  Liver enzymes a bit better but still elevated - will get ultrasound   Lipase / pancreatic enzyme chronically elevated, but stable   Iron deficiency anemia, new problem. Iron started, will recheck  Subclinical Hypothyroid based on labs, unlikely to be an issue but weill recheck   Everyone should have blood pressure checked once per year.   Tobacco: don't!   Alcohol: responsible moderation is ok for most adults - if you have concerns about your alcohol intake, please talk to me!   Exercise: as tolerated to reduce risk of cardiovascular disease and diabetes. Strength training will also prevent osteoporosis.   Mental health: if need for mental health care (medicines, counseling, other), or concerns about moods, please let me know!   Sexual health: if need for STD testing, or if concerns with libido/pain problems, please let me know!  Advanced Directive: Living Will and/or Healthcare Power of Attorney recommended for all adults, regardless of age or health.  Vaccines  Flu vaccine: recommended for almost everyone, every fall.   Shingles vaccine: Shingrix recommended after age 8. Rx sent to pharmacy   Pneumonia vaccines: Prevnar and Pneumovax recommended after age 4  Tetanus booster: Tdap recommended every 10 years. You're good until 2027.    Cancer screenings   Colon cancer screening: recommended for everyone at age 49, but some folks need a colonoscopy sooner if risk factors   Breast cancer screening: mammogram recommended annually after age 5. Last one done 03/2018 was ok. Due for follow-up.   Cervical cancer screening: Can stop at age 14 or w/ hysterectomy.   Lung cancer screening: smoking less than 30-pack-year, no screening needed.  Infection screenings . HIV: recommended screening at least once age 36-65, more often as needed. . Gonorrhea/Chlamydia: screening as  needed . Hepatitis C: recommended for anyone born 19-1965 . TB: certain at-risk populations, or depending on work requirements and/or travel history Other . Bone Density Test: recommended for women at age 15, sooner after age 33 if smoker

## 2019-05-03 ENCOUNTER — Other Ambulatory Visit: Payer: Self-pay | Admitting: Physician Assistant

## 2019-05-03 NOTE — Telephone Encounter (Signed)
[facility] requesting med refill for naltrexone. Pls advise if refill appropriate. Thanks.

## 2019-05-12 ENCOUNTER — Other Ambulatory Visit: Payer: Self-pay | Admitting: Physician Assistant

## 2019-05-17 ENCOUNTER — Other Ambulatory Visit: Payer: Self-pay | Admitting: Physician Assistant

## 2019-05-27 ENCOUNTER — Ambulatory Visit: Payer: BC Managed Care – PPO

## 2019-06-02 ENCOUNTER — Telehealth: Payer: Self-pay

## 2019-06-02 ENCOUNTER — Other Ambulatory Visit: Payer: Self-pay | Admitting: Osteopathic Medicine

## 2019-06-02 MED ORDER — NALTREXONE HCL 50 MG PO TABS: 100.0000 mg | ORAL_TABLET | Freq: Every day | ORAL | 1 refills | Status: DC

## 2019-06-02 NOTE — Telephone Encounter (Signed)
Lexapro was sent at last visit.  Patient is due for follow-up based on last note follow Up Instructions: Return in about 6 weeks (around 06/02/2019) for w/ labs prior to visit (orders are in), follow-up medication change from zoloft to lexapo .

## 2019-06-02 NOTE — Telephone Encounter (Signed)
Updated and sent to pharmacy.

## 2019-06-02 NOTE — Telephone Encounter (Signed)
[facility] requesting med clarification. As per pharmacist - pt states she is taking Naltrexone 50 mg 2 tabs am/pm. Requesting an updated rx to be sent into pharmacy. Pls advise, thanks.

## 2019-06-02 NOTE — Telephone Encounter (Signed)
Pt called stating provider was to send an alternative rx for depression medication to the pharmacy. She is also requesting a referral for colonoscopy. Pls advise, thanks.

## 2019-06-03 ENCOUNTER — Telehealth: Payer: Self-pay

## 2019-06-03 MED ORDER — NAPROXEN 375 MG PO TABS: ORAL_TABLET | ORAL | 0 refills | Status: DC

## 2019-06-03 NOTE — Telephone Encounter (Signed)
I am okay to refill the naproxen but we did not discuss controlled substance medications refills or significant pain problems at her visit with me.  She should schedule a virtual visit to go over this, or if she is having arthritis/pain problems she may benefit from sports medicine referral.

## 2019-06-03 NOTE — Telephone Encounter (Signed)
Pt has been updated of provider's note. Pt agree with provider's recommendations. Transferred to [place] for appt scheduling. No other inquiries during call.

## 2019-06-03 NOTE — Telephone Encounter (Signed)
Pt has been updated and agrees with provider's recommendation. Aware [facility]. Pt has an appt scheduled on 07/07/2019. No other inquiries during call.

## 2019-06-03 NOTE — Addendum Note (Signed)
Addended by: Maryla Morrow on: 06/03/2019 11:00 AM   Modules accepted: Orders

## 2019-06-03 NOTE — Telephone Encounter (Signed)
Pt called requesting med refills for hydrocodone-ace and naproxen. Written by historical provider. Pls send to [facility] in [facility].

## 2019-06-03 NOTE — Telephone Encounter (Signed)
Pt was updated regarding of updated med change. No other inquiries during call.

## 2019-07-01 ENCOUNTER — Other Ambulatory Visit: Payer: Self-pay

## 2019-07-01 ENCOUNTER — Ambulatory Visit (INDEPENDENT_AMBULATORY_CARE_PROVIDER_SITE_OTHER): Payer: BC Managed Care – PPO

## 2019-07-01 DIAGNOSIS — Z0000 Encounter for general adult medical examination without abnormal findings: Secondary | ICD-10-CM | POA: Diagnosis not present

## 2019-07-01 DIAGNOSIS — F411 Generalized anxiety disorder: Secondary | ICD-10-CM | POA: Diagnosis not present

## 2019-07-01 DIAGNOSIS — Z1231 Encounter for screening mammogram for malignant neoplasm of breast: Secondary | ICD-10-CM | POA: Diagnosis not present

## 2019-07-01 DIAGNOSIS — E0590 Thyrotoxicosis, unspecified without thyrotoxic crisis or storm: Secondary | ICD-10-CM | POA: Diagnosis not present

## 2019-07-01 DIAGNOSIS — D509 Iron deficiency anemia, unspecified: Secondary | ICD-10-CM | POA: Diagnosis not present

## 2019-07-01 DIAGNOSIS — E782 Mixed hyperlipidemia: Secondary | ICD-10-CM | POA: Diagnosis not present

## 2019-07-02 LAB — T4, FREE: Free T4: 1.3 ng/dL (ref 0.8–1.8)

## 2019-07-02 LAB — COMPLETE METABOLIC PANEL WITH GFR
AG Ratio: 1.7 (calc) (ref 1.0–2.5)
ALT: 100 U/L — ABNORMAL HIGH (ref 6–29)
AST: 233 U/L — ABNORMAL HIGH (ref 10–35)
Albumin: 4.2 g/dL (ref 3.6–5.1)
Alkaline phosphatase (APISO): 107 U/L (ref 37–153)
BUN/Creatinine Ratio: 5 (calc) — ABNORMAL LOW (ref 6–22)
BUN: 5 mg/dL — ABNORMAL LOW (ref 7–25)
CO2: 30 mmol/L (ref 20–32)
Calcium: 9 mg/dL (ref 8.6–10.4)
Chloride: 96 mmol/L — ABNORMAL LOW (ref 98–110)
Creat: 0.94 mg/dL (ref 0.50–0.99)
GFR, Est African American: 75 mL/min/{1.73_m2} (ref >=60)
GFR, Est Non African American: 65 mL/min/{1.73_m2} (ref >=60)
Globulin: 2.5 g/dL (calc) (ref 1.9–3.7)
Glucose, Bld: 79 mg/dL (ref 65–99)
Potassium: 3.9 mmol/L (ref 3.5–5.3)
Sodium: 136 mmol/L (ref 135–146)
Total Bilirubin: 0.5 mg/dL (ref 0.2–1.2)
Total Protein: 6.7 g/dL (ref 6.1–8.1)

## 2019-07-02 LAB — IRON,TIBC AND FERRITIN PANEL
%SAT: 38 % (calc) (ref 16–45)
Ferritin: 378 ng/mL — ABNORMAL HIGH (ref 16–288)
Iron: 99 ug/dL (ref 45–160)
TIBC: 262 mcg/dL (calc) (ref 250–450)

## 2019-07-02 LAB — CBC
HCT: 39.1 % (ref 35.0–45.0)
Hemoglobin: 13 g/dL (ref 11.7–15.5)
MCH: 32.7 pg (ref 27.0–33.0)
MCHC: 33.2 g/dL (ref 32.0–36.0)
MCV: 98.2 fL (ref 80.0–100.0)
MPV: 11.5 fL (ref 7.5–12.5)
Platelets: 229 10*3/uL (ref 140–400)
RBC: 3.98 10*6/uL (ref 3.80–5.10)
RDW: 19.6 % — ABNORMAL HIGH (ref 11.0–15.0)
WBC: 4.7 10*3/uL (ref 3.8–10.8)

## 2019-07-02 LAB — TSH: TSH: 0.61 mIU/L (ref 0.40–4.50)

## 2019-07-07 ENCOUNTER — Ambulatory Visit (INDEPENDENT_AMBULATORY_CARE_PROVIDER_SITE_OTHER): Payer: BC Managed Care – PPO | Admitting: Osteopathic Medicine

## 2019-07-07 ENCOUNTER — Encounter: Payer: Self-pay | Admitting: Osteopathic Medicine

## 2019-07-07 VITALS — Wt 143.0 lb

## 2019-07-07 DIAGNOSIS — D509 Iron deficiency anemia, unspecified: Secondary | ICD-10-CM

## 2019-07-07 DIAGNOSIS — R945 Abnormal results of liver function studies: Secondary | ICD-10-CM | POA: Diagnosis not present

## 2019-07-07 DIAGNOSIS — F1011 Alcohol abuse, in remission: Secondary | ICD-10-CM

## 2019-07-07 DIAGNOSIS — R748 Abnormal levels of other serum enzymes: Secondary | ICD-10-CM

## 2019-07-07 DIAGNOSIS — R7989 Other specified abnormal findings of blood chemistry: Secondary | ICD-10-CM

## 2019-07-07 DIAGNOSIS — F1010 Alcohol abuse, uncomplicated: Secondary | ICD-10-CM

## 2019-07-07 NOTE — Progress Notes (Signed)
Virtual Visit via Phone  I connected with      Michelle BoomLeslie Ellen Hemme on 07/07/19 at 10:00 AM by a telemedicine application and verified that I am speaking with the correct person using two identifiers.  Patient is at home I am in office   I discussed the limitations of evaluation and management by telemedicine and the availability of in person appointments. The patient expressed understanding and agreed to proceed.  History of Present Illness: Michelle Hughes is a 63 y.o. female who would like to discuss lab results - see details below, and anxiety recheck    Anxiety: Has been on Zoloft for years, doesn't seem to be helping anymore. Has been on Wellbutrin in the past for smoking cessation and this was not tolerated. Last visit we started Lexapro 10 mg to titrate up to 20 mg. She didn't feel the Lexapro was a good fit so she went back to the Zoloft. She did have some increased stress w/ work and the recent election. She went back to the Zoloft and went back to drinking a bit more, her other doctor increased Naltrexone.   Liver: Liver enzymes significantly elevated compared to previous measurements, have been above normal in the past.  Patient would like to try rechecking these, she has stopped drinking again and been consistent in abstinence from alcohol, see assessment/plan below  [facility] 07/07/2019 04/21/2019 08/27/2018  Decreased Interest 1 0 1  Down, Depressed, Hopeless 1 0 1  PHQ - 2 Score 2 0 2  Altered sleeping 0 - 0  Tired, decreased energy 1 - 0  Change in appetite 1 - 0  Feeling bad or failure about yourself  1 - 0  Trouble concentrating 0 - 1  Moving slowly or fidgety/restless 0 - 0  Suicidal thoughts 0 - 0  PHQ-9 Score 5 - 3  Difficult doing work/chores Not difficult at all - Not difficult at all   GAD 7 : Generalized Anxiety Score 07/07/2019 04/21/2019 08/27/2018  Nervous, Anxious, on Edge 1 0 0  Control/stop worrying 0 0 0  Worry too much - different  things 0 0 0  Trouble relaxing 0 0 0  Restless 0 1 0  Easily annoyed or irritable 1 0 0  Afraid - awful might happen 0 1 0  Total GAD 7 Score 2 2 0  Anxiety Difficulty Not difficult at all Not difficult at all -    Abnormal labs: Elevated liver enzymes 03/2019, to be improved from levels 03/2018. Levels  Iron deficiency anemia 03/2019, normal CBC 03/2018. We started Iron supplements 04/2019. Hgb improved from 9.4 (03/2019) to 13.0 (06/2019) Elevated lipase 03/2019, previous measurement 99 in 03/2018 Subclinical hyperthyroidism. New as of 03/2019 but really borderline TSH and normal T4. Repeat 06/2019 was all normal range.  Recent Results (from the past 2160 hour(s))  CBC     Status: Abnormal   Collection Time: 04/09/19  9:49 AM  Result Value Ref Range   WBC 5.5 3.8 - 10.8 Thousand/uL   RBC 4.00 3.80 - 5.10 Million/uL   Hemoglobin 9.4 (L) 11.7 - 15.5 g/dL   HCT 95.632.5 (L) 21.335.0 - 08.645.0 %   MCV 81.3 80.0 - 100.0 fL   MCH 23.5 (L) 27.0 - 33.0 pg   MCHC 28.9 (L) 32.0 - 36.0 g/dL   RDW 57.818.2 (H) 46.911.0 - 62.915.0 %   Platelets 332 140 - 400 Thousand/uL   MPV 10.3 7.5 - 12.5 fL  COMPLETE METABOLIC PANEL  WITH GFR     Status: Abnormal   Collection Time: 04/09/19  9:49 AM  Result Value Ref Range   Glucose, Bld 93 65 - 139 mg/dL    Comment: .        Non-fasting reference interval .    BUN 9 7 - 25 mg/dL   Creat 0.73 0.50 - 0.99 mg/dL    Comment: For patients >1 years of age, the reference limit for Creatinine is approximately 13% higher for people identified as African-American. .    GFR, Est Non African American 88 > OR = 60 mL/min/1.53m2   GFR, Est African American 102 > OR = 60 mL/min/1.20m2   BUN/Creatinine Ratio NOT APPLICABLE 6 - 22 (calc)   Sodium 136 135 - 146 mmol/L   Potassium 4.1 3.5 - 5.3 mmol/L   Chloride 99 98 - 110 mmol/L   CO2 28 20 - 32 mmol/L   Calcium 9.2 8.6 - 10.4 mg/dL   Total Protein 6.9 6.1 - 8.1 g/dL   Albumin 4.3 3.6 - 5.1 g/dL   Globulin 2.6 1.9 - 3.7 g/dL  (calc)   AG Ratio 1.7 1.0 - 2.5 (calc)   Total Bilirubin 0.4 0.2 - 1.2 mg/dL   Alkaline phosphatase (APISO) 63 37 - 153 U/L   AST 94 (H) 10 - 35 U/L   ALT 45 (H) 6 - 29 U/L  Lipid panel     Status: None   Collection Time: 04/09/19  9:49 AM  Result Value Ref Range   Cholesterol 168 <200 mg/dL   HDL 74 > OR = 50 mg/dL   Triglycerides 140 <150 mg/dL   LDL Cholesterol (Calc) 72 mg/dL (calc)    Comment: Reference range: <100 . Desirable range <100 mg/dL for primary prevention;   <70 mg/dL for patients with CHD or diabetic patients  with > or = 2 CHD risk factors. Marland Kitchen LDL-C is now calculated using the Martin-Hopkins  calculation, which is a validated novel method providing  better accuracy than the Friedewald equation in the  estimation of LDL-C.  Cresenciano Genre et al. Annamaria Helling. 1610;960(45): 2061-2068  (http://education.[facility].com/faq/FAQ164)    Total CHOL/HDL Ratio 2.3 <5.0 (calc)   Non-HDL Cholesterol (Calc) 94 <130 mg/dL (calc)    Comment: For patients with diabetes plus 1 major ASCVD risk  factor, treating to a non-HDL-C goal of <100 mg/dL  (LDL-C of <70 mg/dL) is considered a therapeutic  option.   Lipase     Status: Abnormal   Collection Time: 04/09/19  9:49 AM  Result Value Ref Range   Lipase 112 (H) 7 - 60 U/L  TSH     Status: Abnormal   Collection Time: 04/09/19  9:49 AM  Result Value Ref Range   TSH 0.39 (L) 0.40 - 4.50 mIU/L  Iron, TIBC and Ferritin Panel     Status: Abnormal   Collection Time: 04/09/19  9:49 AM  Result Value Ref Range   Iron 20 (L) 45 - 160 mcg/dL   TIBC 441 250 - 450 mcg/dL (calc)   %SAT 5 (L) 16 - 45 % (calc)   Ferritin 16 16 - 288 ng/mL  T4, free     Status: None   Collection Time: 04/09/19  9:49 AM  Result Value Ref Range   Free T4 1.0 0.8 - 1.8 ng/dL  TEST AUTHORIZATION     Status: None   Collection Time: 04/09/19  9:49 AM  Result Value Ref Range   TEST NAME: IRON, TIBC AND FERRITIN PANEL  TEST CODE: [id number]    CLIENT  CONTACT: Latrelle Dodrill WNGLN    REPORT ALWAYS MESSAGE SIGNATURE      Comment: . The laboratory testing on this patient was verbally requested or confirmed by the ordering physician or his or her authorized representative after contact with an employee of [employer]. Federal regulations require that we maintain on file written authorization for all laboratory testing.  Accordingly we are asking that the ordering physician or his or her authorized representative sign a copy of this report and promptly return it to the client service representative. . . Signature:____________________________________________________ . Please fax this signed page to [phone] or return it via your [employer] courier.   COMPLETE METABOLIC PANEL WITH GFR     Status: Abnormal   Collection Time: 07/01/19  9:00 AM  Result Value Ref Range   Glucose, Bld 79 65 - 99 mg/dL    Comment: .            Fasting reference interval .    BUN 5 (L) 7 - 25 mg/dL   Creat 5.80 9.98 - 3.38 mg/dL    Comment: For patients >69 years of age, the reference limit for Creatinine is approximately 13% higher for people identified as African-American. .    GFR, Est Non African American 65 > OR = 60 mL/min/1.44m2   GFR, Est African American 75 > OR = 60 mL/min/1.15m2   BUN/Creatinine Ratio 5 (L) 6 - 22 (calc)   Sodium 136 135 - 146 mmol/L   Potassium 3.9 3.5 - 5.3 mmol/L   Chloride 96 (L) 98 - 110 mmol/L   CO2 30 20 - 32 mmol/L   Calcium 9.0 8.6 - 10.4 mg/dL   Total Protein 6.7 6.1 - 8.1 g/dL   Albumin 4.2 3.6 - 5.1 g/dL   Globulin 2.5 1.9 - 3.7 g/dL (calc)   AG Ratio 1.7 1.0 - 2.5 (calc)   Total Bilirubin 0.5 0.2 - 1.2 mg/dL   Alkaline phosphatase (APISO) 107 37 - 153 U/L   AST 233 (H) 10 - 35 U/L   ALT 100 (H) 6 - 29 U/L  CBC     Status: Abnormal   Collection Time: 07/01/19  9:00 AM  Result Value Ref Range   WBC 4.7 3.8 - 10.8 Thousand/uL   RBC 3.98 3.80 - 5.10 Million/uL   Hemoglobin 13.0 11.7 - 15.5  g/dL   HCT 25.0 53.9 - 76.7 %   MCV 98.2 80.0 - 100.0 fL   MCH 32.7 27.0 - 33.0 pg   MCHC 33.2 32.0 - 36.0 g/dL   RDW 34.1 (H) 93.7 - 90.2 %   Platelets 229 140 - 400 Thousand/uL   MPV 11.5 7.5 - 12.5 fL  Fe+TIBC+Fer     Status: Abnormal   Collection Time: 07/01/19  9:00 AM  Result Value Ref Range   Iron 99 45 - 160 mcg/dL   TIBC 409 735 - 329 mcg/dL (calc)   %SAT 38 16 - 45 % (calc)   Ferritin 378 (H) 16 - 288 ng/mL  TSH     Status: None   Collection Time: 07/01/19  9:00 AM  Result Value Ref Range   TSH 0.61 0.40 - 4.50 mIU/L  T4, free     Status: None   Collection Time: 07/01/19  9:00 AM  Result Value Ref Range   Free T4 1.3 0.8 - 1.8 ng/dL        Observations/Objective: Wt 143 lb (64.9 kg)  BMI 23.80 kg/m  BP Readings from Last 3 Encounters:  04/21/19 (!) 119/53  08/27/18 (!) 118/53   Exam: Normal Speech.    Lab and Radiology Results No results found for this or any previous visit (from the past 72 hour(s)). No results found.     Assessment and Plan: 62 y.o. female with The primary encounter diagnosis was Alcohol abuse, in remission. Diagnoses of Abnormal LFTs, Iron deficiency anemia, unspecified iron deficiency anemia type, Elevated lipase, and AA (alcohol abuse) were also pertinent to this visit.  We may be inducing iron overload a bit, anemia has resolved but ferritin levels are a bit on the high side, I advised patient to back off on the iron supplementation from twice daily to once every other day, patient also notes consistent abstinence from alcohol which hopefully will also make a difference in the liver enzymes.  I would like to get blood work redone in the next couple of weeks to make sure everything is trending back toward normal, would probably continue to follow the liver enzymes or possibly refer to GI depending on how things are looking, advised patient that I would like her to get an ultrasound, she is nervous to do this at the moment and a  Covid surgeon I think that it is reasonable to wait some time though I would not put this off forever!  PDMP not reviewed this encounter. Orders Placed This Encounter  Procedures  . COMPLETE METABOLIC PANEL WITH GFR  . CBC  . Lipase  . Fe+TIBC+Fer   No orders of the defined types were placed in this encounter.     Follow Up Instructions: Return for RECHECK PENDING LAB RESULTS FOR LIVER, IRON  / IF WORSE OR CHANGE.    I discussed the assessment and treatment plan with the patient. The patient was provided an opportunity to ask questions and all were answered. The patient agreed with the plan and demonstrated an understanding of the instructions.   The patient was advised to call back or seek an in-person evaluation if any new concerns, if symptoms worsen or if the condition fails to improve as anticipated.  25 minutes of non-face-to-face time was provided during this encounter.      . . . . . . . . . . . . . Marland Kitchen                   Historical information moved to improve visibility of documentation.  Past Medical History:  Diagnosis Date  . Anxiety   . Depression    Past Surgical History:  Procedure Laterality Date  . ABDOMINAL HYSTERECTOMY    . TONSILLECTOMY     Social History   Tobacco Use  . Smoking status: Current Every Day Smoker    Packs/day: 0.50    Years: 40.00    Pack years: 20.00    Types: Cigarettes  . Smokeless tobacco: Never Used  Substance Use Topics  . Alcohol use: Yes    Alcohol/week: 0.0 standard drinks    Comment: 18 servings a week. Now 1-2 glasses per night   family history includes Alcohol abuse in her father; Schizophrenia in her paternal uncle.  Medications: Current Outpatient Medications  Medication Sig Dispense Refill  . albuterol (PROVENTIL HFA;VENTOLIN HFA) 108 (90 Base) MCG/ACT inhaler Inhale into the lungs.    Marland Kitchen atorvastatin (LIPITOR) 40 MG tablet TAKE 1 TABLET BY MOUTH EVERY DAY 90 tablet 3  .  carvedilol (COREG) 25 MG tablet Take 1 tablet (  25 mg total) by mouth 2 (two) times daily with a meal. 180 tablet 3  . estradiol (VIVELLE-DOT) 0.05 MG/24HR patch Place 1 patch (0.05 mg total) onto the skin 2 (two) times a week. 24 patch 1  . ferrous sulfate 325 (65 FE) MG tablet Take 1 tablet (325 mg total) by mouth 2 (two) times daily with a meal. 180 tablet 1  . fluticasone (FLONASE) 50 MCG/ACT nasal spray 1 spray in each nostril daily    . gabapentin (NEURONTIN) 100 MG capsule Take 1 capsule (100 mg total) by mouth 3 (three) times daily. 270 capsule 3  . HYDROcodone-acetaminophen (NORCO/VICODIN) 5-325 MG tablet Take by mouth.    . Influenza Vac Subunit Quad (FLUCELVAX QUADRIVALENT) 0.5 ML SUSY TO BE ADMINISTERED BY PHARMACIST FOR IMMUNIZATION    . lisinopril (ZESTRIL) 20 MG tablet TAKE 1 TABLET BY MOUTH EVERY DAY 90 tablet 0  . meclizine (ANTIVERT) 25 MG tablet TAKE 1 TABLET BY MOUTH TWICE A DAY 60 tablet 0  . methocarbamol (ROBAXIN) 500 MG tablet Take by mouth.    . naltrexone (DEPADE) 50 MG tablet Take 2 tablets (100 mg total) by mouth daily. 180 tablet 1  . naproxen (NAPROSYN) 375 MG tablet TAKE 1 TABLET (375 MG TOTAL) BY MOUTH 2 (TWO) TIMES DAILY AS NEEDED (BACK PAIN.). TAKE WITH FOOD. 180 tablet 0  . sertraline (ZOLOFT) 100 MG tablet Take 100 mg by mouth daily.    . traZODone (DESYREL) 50 MG tablet Take 1 tablet (50 mg total) by mouth at bedtime. 90 tablet 3   No current facility-administered medications for this visit.    Allergies  Allergen Reactions  . Sulfa Antibiotics Rash  . Sulfasalazine

## 2019-07-21 ENCOUNTER — Telehealth: Payer: Self-pay | Admitting: Osteopathic Medicine

## 2019-07-21 NOTE — Telephone Encounter (Signed)
Patient is aware and will get by the labs next week. She reports she had a scare and thought she had Covid and the test was negative. No other questions at this time.

## 2019-07-21 NOTE — Telephone Encounter (Addendum)
Please call patient - remind her she needs blood work done, orders are in      ----- (reminder Message from Michelle Hughes, Shaniko sent at 07/07/2019  1:12 PM EST) ----- Has patient gotten blood work yet to recheck liver enzymes, iron, lipase?  See progress note 11/25

## 2019-08-23 ENCOUNTER — Encounter: Payer: Self-pay | Admitting: Osteopathic Medicine

## 2019-09-07 ENCOUNTER — Other Ambulatory Visit: Payer: Self-pay | Admitting: Osteopathic Medicine

## 2019-10-29 DIAGNOSIS — F1011 Alcohol abuse, in remission: Secondary | ICD-10-CM | POA: Diagnosis not present

## 2019-10-29 DIAGNOSIS — R748 Abnormal levels of other serum enzymes: Secondary | ICD-10-CM | POA: Diagnosis not present

## 2019-10-29 DIAGNOSIS — R945 Abnormal results of liver function studies: Secondary | ICD-10-CM | POA: Diagnosis not present

## 2019-10-29 DIAGNOSIS — D509 Iron deficiency anemia, unspecified: Secondary | ICD-10-CM | POA: Diagnosis not present

## 2019-10-30 LAB — IRON,TIBC AND FERRITIN PANEL
%SAT: 19 % (calc) (ref 16–45)
Ferritin: 47 ng/mL (ref 16–288)
Iron: 63 ug/dL (ref 45–160)
TIBC: 331 mcg/dL (calc) (ref 250–450)

## 2019-10-30 LAB — CBC
HCT: 39.6 % (ref 35.0–45.0)
Hemoglobin: 13.2 g/dL (ref 11.7–15.5)
MCH: 32.9 pg (ref 27.0–33.0)
MCHC: 33.3 g/dL (ref 32.0–36.0)
MCV: 98.8 fL (ref 80.0–100.0)
MPV: 10.8 fL (ref 7.5–12.5)
Platelets: 314 10*3/uL (ref 140–400)
RBC: 4.01 10*6/uL (ref 3.80–5.10)
RDW: 12.2 % (ref 11.0–15.0)
WBC: 5.9 10*3/uL (ref 3.8–10.8)

## 2019-10-30 LAB — COMPLETE METABOLIC PANEL WITH GFR
AG Ratio: 1.6 (calc) (ref 1.0–2.5)
ALT: 51 U/L — ABNORMAL HIGH (ref 6–29)
AST: 122 U/L — ABNORMAL HIGH (ref 10–35)
Albumin: 4.1 g/dL (ref 3.6–5.1)
Alkaline phosphatase (APISO): 136 U/L (ref 37–153)
BUN: 8 mg/dL (ref 7–25)
CO2: 30 mmol/L (ref 20–32)
Calcium: 9.4 mg/dL (ref 8.6–10.4)
Chloride: 99 mmol/L (ref 98–110)
Creat: 0.56 mg/dL (ref 0.50–0.99)
GFR, Est African American: 115 mL/min/{1.73_m2} (ref >=60)
GFR, Est Non African American: 99 mL/min/{1.73_m2} (ref >=60)
Globulin: 2.6 g/dL (calc) (ref 1.9–3.7)
Glucose, Bld: 99 mg/dL (ref 65–99)
Potassium: 4.5 mmol/L (ref 3.5–5.3)
Sodium: 136 mmol/L (ref 135–146)
Total Bilirubin: 0.4 mg/dL (ref 0.2–1.2)
Total Protein: 6.7 g/dL (ref 6.1–8.1)

## 2019-10-30 LAB — LIPASE: Lipase: 171 U/L — ABNORMAL HIGH (ref 7–60)

## 2019-11-01 DIAGNOSIS — Z23 Encounter for immunization: Secondary | ICD-10-CM | POA: Diagnosis not present

## 2019-11-06 ENCOUNTER — Other Ambulatory Visit: Payer: Self-pay | Admitting: Osteopathic Medicine

## 2019-11-10 ENCOUNTER — Encounter: Payer: Self-pay | Admitting: Osteopathic Medicine

## 2019-11-10 ENCOUNTER — Telehealth (INDEPENDENT_AMBULATORY_CARE_PROVIDER_SITE_OTHER): Payer: Self-pay | Admitting: Osteopathic Medicine

## 2019-11-10 VITALS — Temp 97.8°F | Wt 139.5 lb

## 2019-11-10 DIAGNOSIS — R7989 Other specified abnormal findings of blood chemistry: Secondary | ICD-10-CM

## 2019-11-10 DIAGNOSIS — R748 Abnormal levels of other serum enzymes: Secondary | ICD-10-CM

## 2019-11-10 DIAGNOSIS — F1011 Alcohol abuse, in remission: Secondary | ICD-10-CM

## 2019-11-10 DIAGNOSIS — R945 Abnormal results of liver function studies: Secondary | ICD-10-CM

## 2019-11-10 NOTE — Progress Notes (Signed)
Virtual Visit via Phone  I connected with      Michelle Hughes on 11/10/19 at 10:24 AM  by a telemedicine application and verified that I am speaking with the correct person using two identifiers.  Patient is at home I am in office   I discussed the limitations of evaluation and management by telemedicine and the availability of in person appointments. The patient expressed understanding and agreed to proceed.  History of Present Illness: Michelle Hughes is a 64 y.o. female who would like to discuss lab results, balance problems, GI problem   Balance/neuro:   Balance issues have been present for years. LOC 4 episodes in about 2 years, feels like stands up to fast and gets lightheaded.   GI:  Increased BM frequency. Better lately. Hemorrhoids are an issue but respond well to OTC treatment, pt is concerned about whether she might need further workup   Labs/trnasaminitis:   Hx EtOH abuse/dependence. Anx/depresison. On Zoloft, Naltrexone. We backed off on iron supplementation. Liver enzymes still above normal but definitely better than they were. Lipase still above normal, little bit worse than when last measured 6 months ago. Iron levels and blood counts back to normal. RUQ [place] ordered but not done     Recent Results (from the past 2160 hour(s))  COMPLETE METABOLIC PANEL WITH GFR     Status: Abnormal   Collection Time: 10/29/19  9:34 AM  Result Value Ref Range   Glucose, Bld 99 65 - 99 mg/dL    Comment: .            Fasting reference interval .    BUN 8 7 - 25 mg/dL   Creat 0.56 0.50 - 0.99 mg/dL    Comment: For patients >35 years of age, the reference limit for Creatinine is approximately 13% higher for people identified as African-American. .    GFR, Est Non African American 99 > OR = 60 mL/min/1.65m2   GFR, Est African American 115 > OR = 60 mL/min/1.45m2   BUN/Creatinine Ratio NOT APPLICABLE 6 - 22 (calc)   Sodium 136 135 - 146 mmol/L   Potassium 4.5 3.5 -  5.3 mmol/L   Chloride 99 98 - 110 mmol/L   CO2 30 20 - 32 mmol/L   Calcium 9.4 8.6 - 10.4 mg/dL   Total Protein 6.7 6.1 - 8.1 g/dL   Albumin 4.1 3.6 - 5.1 g/dL   Globulin 2.6 1.9 - 3.7 g/dL (calc)   AG Ratio 1.6 1.0 - 2.5 (calc)   Total Bilirubin 0.4 0.2 - 1.2 mg/dL   Alkaline phosphatase (APISO) 136 37 - 153 U/L   AST 122 (H) 10 - 35 U/L   ALT 51 (H) 6 - 29 U/L  CBC     Status: None   Collection Time: 10/29/19  9:34 AM  Result Value Ref Range   WBC 5.9 3.8 - 10.8 Thousand/uL   RBC 4.01 3.80 - 5.10 Million/uL   Hemoglobin 13.2 11.7 - 15.5 g/dL   HCT 39.6 35.0 - 45.0 %   MCV 98.8 80.0 - 100.0 fL   MCH 32.9 27.0 - 33.0 pg   MCHC 33.3 32.0 - 36.0 g/dL   RDW 12.2 11.0 - 15.0 %   Platelets 314 140 - 400 Thousand/uL   MPV 10.8 7.5 - 12.5 fL  Lipase     Status: Abnormal   Collection Time: 10/29/19  9:34 AM  Result Value Ref Range   Lipase 171 (H) 7 -  60 U/L  Fe+TIBC+Fer     Status: None   Collection Time: 10/29/19  9:34 AM  Result Value Ref Range   Iron 63 45 - 160 mcg/dL   TIBC 704 888 - 916 mcg/dL (calc)   %SAT 19 16 - 45 % (calc)   Ferritin 47 16 - 288 ng/mL       Observations/Objective: Temp 97.8 F (36.6 C) (Oral)   Wt 139 lb 8 oz (63.3 kg)   BMI 23.21 kg/m  BP Readings from Last 3 Encounters:  04/21/19 (!) 119/53  08/27/18 (!) 118/53   Exam: Normal Speech.  NAD  Lab and Radiology Results No results found for this or any previous visit (from the past 72 hour(s)). No results found.     Assessment and Plan: 64 y.o. female with The primary encounter diagnosis was Alcohol abuse, in remission. Diagnoses of Abnormal LFTs and Elevated lipase were also pertinent to this visit.  EtOH abuse in relative remission, pt still consumes some EtOH, advised d/c this if possible, managed by psychiatry w/ naltrexone. Advised needs [place]!   Dizziness sounds like orthostatic hypotension / syncope  Will try stopping Coreg, leaving Lisinopril  Bowel issues sound like  IBS-D Advised continue OTC treatments Will consider Rx   Hemorrhoids sound benign, if concerning please RTC   PDMP not reviewed this encounter. No orders of the defined types were placed in this encounter.  No orders of the defined types were placed in this encounter.  There are no Patient Instructions on file for this visit.    Follow Up Instructions: Return for RECHECK PENDING [place] RESULTS / IF WORSE OR CHANGE.    I discussed the assessment and treatment plan with the patient. The patient was provided an opportunity to ask questions and all were answered. The patient agreed with the plan and demonstrated an understanding of the instructions.   The patient was advised to call back or seek an in-person evaluation if any new concerns, if symptoms worsen or if the condition fails to improve as anticipated.  30 minutes of non-face-to-face time was provided during this encounter.      . . . . . . . . . . . . . Marland Kitchen                   Historical information moved to improve visibility of documentation.  Past Medical History:  Diagnosis Date  . Anxiety   . Depression    Past Surgical History:  Procedure Laterality Date  . ABDOMINAL HYSTERECTOMY    . TONSILLECTOMY     Social History   Tobacco Use  . Smoking status: Current Every Day Smoker    Packs/day: 0.50    Years: 40.00    Pack years: 20.00    Types: Cigarettes  . Smokeless tobacco: Never Used  Substance Use Topics  . Alcohol use: Yes    Alcohol/week: 0.0 standard drinks    Comment: 18 servings a week. Now 1-2 glasses per night   family history includes Alcohol abuse in her father; Schizophrenia in her paternal uncle.  Medications: Current Outpatient Medications  Medication Sig Dispense Refill  . albuterol (PROVENTIL HFA;VENTOLIN HFA) 108 (90 Base) MCG/ACT inhaler Inhale into the lungs.    Marland Kitchen atorvastatin (LIPITOR) 40 MG tablet TAKE 1 TABLET BY MOUTH EVERY DAY 90 tablet 3  .  carvedilol (COREG) 25 MG tablet Take 1 tablet (25 mg total) by mouth 2 (two) times daily with a meal. 180 tablet 3  .  estradiol (VIVELLE-DOT) 0.05 MG/24HR patch Place 1 patch (0.05 mg total) onto the skin 2 (two) times a week. 24 patch 1  . ferrous sulfate 325 (65 FE) MG tablet Take 1 tablet (325 mg total) by mouth 2 (two) times daily with a meal. 180 tablet 1  . fluticasone (FLONASE) 50 MCG/ACT nasal spray 1 spray in each nostril daily    . gabapentin (NEURONTIN) 100 MG capsule Take 1 capsule (100 mg total) by mouth 3 (three) times daily. 270 capsule 3  . HYDROcodone-acetaminophen (NORCO/VICODIN) 5-325 MG tablet Take by mouth.    . Influenza Vac Subunit Quad (FLUCELVAX QUADRIVALENT) 0.5 ML SUSY TO BE ADMINISTERED BY PHARMACIST FOR IMMUNIZATION    . lisinopril (ZESTRIL) 20 MG tablet TAKE 1 TABLET BY MOUTH EVERY DAY 30 tablet 0  . meclizine (ANTIVERT) 25 MG tablet TAKE 1 TABLET BY MOUTH TWICE A DAY 60 tablet 0  . methocarbamol (ROBAXIN) 500 MG tablet Take by mouth.    . naltrexone (DEPADE) 50 MG tablet Take 2 tablets (100 mg total) by mouth daily. 180 tablet 1  . naproxen (NAPROSYN) 375 MG tablet TAKE 1 TABLET (375 MG TOTAL) BY MOUTH 2 (TWO) TIMES DAILY AS NEEDED (BACK PAIN.). TAKE WITH FOOD. 180 tablet 0  . sertraline (ZOLOFT) 100 MG tablet Take 100 mg by mouth daily.    . traZODone (DESYREL) 50 MG tablet Take 1 tablet (50 mg total) by mouth at bedtime. 90 tablet 3   No current facility-administered medications for this visit.   Allergies  Allergen Reactions  . Sulfa Antibiotics Rash  . Sulfasalazine

## 2019-11-29 DIAGNOSIS — Z23 Encounter for immunization: Secondary | ICD-10-CM | POA: Diagnosis not present

## 2019-12-01 ENCOUNTER — Other Ambulatory Visit: Payer: Self-pay | Admitting: Osteopathic Medicine

## 2019-12-03 ENCOUNTER — Other Ambulatory Visit: Payer: Self-pay | Admitting: Osteopathic Medicine

## 2020-01-13 ENCOUNTER — Other Ambulatory Visit: Payer: Self-pay

## 2020-01-13 DIAGNOSIS — I10 Essential (primary) hypertension: Secondary | ICD-10-CM

## 2020-01-13 MED ORDER — LISINOPRIL 20 MG PO TABS: 20.0000 mg | ORAL_TABLET | Freq: Every day | ORAL | 0 refills | Status: DC

## 2020-01-14 ENCOUNTER — Other Ambulatory Visit (INDEPENDENT_AMBULATORY_CARE_PROVIDER_SITE_OTHER): Payer: Self-pay | Admitting: Osteopathic Medicine

## 2020-01-14 DIAGNOSIS — R945 Abnormal results of liver function studies: Secondary | ICD-10-CM

## 2020-01-14 DIAGNOSIS — R7989 Other specified abnormal findings of blood chemistry: Secondary | ICD-10-CM

## 2020-01-14 DIAGNOSIS — F1011 Alcohol abuse, in remission: Secondary | ICD-10-CM

## 2020-01-14 NOTE — Progress Notes (Signed)
[place] order expired  Pt never got done when ordered last 04/2019 5 mins spent by self and staff billed accordingly

## 2020-01-17 ENCOUNTER — Telehealth: Payer: Self-pay

## 2020-01-17 ENCOUNTER — Telehealth: Payer: PRIVATE HEALTH INSURANCE | Admitting: Osteopathic Medicine

## 2020-01-17 DIAGNOSIS — I10 Essential (primary) hypertension: Secondary | ICD-10-CM

## 2020-01-17 MED ORDER — CARVEDILOL 25 MG PO TABS: 25.0000 mg | ORAL_TABLET | Freq: Two times a day (BID) | ORAL | 1 refills | Status: DC

## 2020-01-17 NOTE — Telephone Encounter (Signed)
Refilled Coreg. 

## 2020-01-17 NOTE — Telephone Encounter (Signed)
Returned call to patient. Lisinopril sent to pharmacy on Friday. Patient called this am reports she needs carvedilol (COREG) 25 MG tablet  To be sent to the pharmacy left message for patient to call back and confirm which Bp medication is needed.

## 2020-01-24 ENCOUNTER — Ambulatory Visit: Payer: PRIVATE HEALTH INSURANCE | Admitting: Osteopathic Medicine

## 2020-01-27 ENCOUNTER — Ambulatory Visit: Payer: PRIVATE HEALTH INSURANCE | Admitting: Osteopathic Medicine

## 2020-02-02 ENCOUNTER — Ambulatory Visit (INDEPENDENT_AMBULATORY_CARE_PROVIDER_SITE_OTHER): Payer: PRIVATE HEALTH INSURANCE | Admitting: Osteopathic Medicine

## 2020-02-02 ENCOUNTER — Encounter: Payer: Self-pay | Admitting: Osteopathic Medicine

## 2020-02-02 ENCOUNTER — Other Ambulatory Visit: Payer: Self-pay

## 2020-02-02 VITALS — BP 129/76 | HR 71 | Temp 97.9°F | Wt 136.0 lb

## 2020-02-02 DIAGNOSIS — R42 Dizziness and giddiness: Secondary | ICD-10-CM | POA: Diagnosis not present

## 2020-02-02 DIAGNOSIS — I10 Essential (primary) hypertension: Secondary | ICD-10-CM

## 2020-02-02 DIAGNOSIS — F1011 Alcohol abuse, in remission: Secondary | ICD-10-CM

## 2020-02-02 DIAGNOSIS — R7989 Other specified abnormal findings of blood chemistry: Secondary | ICD-10-CM

## 2020-02-02 DIAGNOSIS — R945 Abnormal results of liver function studies: Secondary | ICD-10-CM | POA: Diagnosis not present

## 2020-02-02 DIAGNOSIS — R790 Abnormal level of blood mineral: Secondary | ICD-10-CM

## 2020-02-02 NOTE — Patient Instructions (Signed)

## 2020-02-02 NOTE — Progress Notes (Signed)
Michelle Hughes is a 64 y.o. female who presents to  [facility] & [facility] Medicine at [facility]  today, 02/02/20, seeking care for the following: . F/u EtOH Hx dependence/abuse - I have ordered US twice and patient has not gotten this done d/t insurance/financial  . Dizzy spells - longstanding, last visit 3 mos ago had d/c Coreg but pt still taking this Rx, continued Lisinopril. Orthostatic symptoms, SBP does drop from 126 lying down to 112 standing      ASSESSMENT & PLAN with other pertinent history/findings:  The primary encounter diagnosis was Orthostatic lightheadedness. Diagnoses of Essential hypertension, Abnormal LFTs, Alcohol abuse, in remission, and Abnormal blood level of iron were also pertinent to this visit.  PLAN: STOP carvedilol  LABS [place]   Constitutional:  . VSS, see nurse notes . General Appearance: alert, well-developed, well-nourished, NAD Neck: . No masses, trachea midline . No thyroid enlargement/tenderness/mass appreciated Respiratory: . Normal respiratory effort . Breath sounds normal, no wheeze/rhonchi/rales Cardiovascular: . S1/S2 normal, no murmur/rub/gallop auscultated . No lower extremity edema Gastrointestinal: . Nontender, no masses . +hepatomegaly, no splenomegaly . No hernia appreciated Musculoskeletal:  . Gait normal . No clubbing/cyanosis of digits Neurological: . No cranial nerve deficit on limited exam . Motor and sensation intact and symmetric Psychiatric: . Normal judgment/insight . Normal mood and affect  Patient Instructions  Orthostatic Hypotension Blood pressure is a measurement of how strongly, or weakly, your blood is pressing against the walls of your arteries. Orthostatic hypotension is a sudden drop in blood pressure that happens when you quickly change positions, such as when you get up from sitting or lying down. Arteries are blood vessels that carry blood from your heart throughout your  body. When blood pressure is too low, you may not get enough blood to your brain or to the rest of your organs. This can cause weakness, light-headedness, rapid heartbeat, and fainting. This can last for just a few seconds or for up to a few minutes. Orthostatic hypotension is usually not a serious problem. However, if it happens frequently or gets worse, it may be a sign of something more serious. What are the causes? This condition may be caused by:  Sudden changes in posture, such as standing up quickly after you have been sitting or lying down.  Blood loss.  Loss of body fluids (dehydration).  Heart problems.  Pregnancy.  Severe infection.  Lack of certain nutrients.  Certain medicines, such as blood pressure medicine or medicines that make the body lose excess fluids (diuretics). Sometimes, this condition can be caused by not taking medicine as directed, such as taking too much of a certain medicine. What increases the risk? The following factors may make you more likely to develop this condition:  Age. Risk increases as you get older.  Conditions that affect the heart or the central nervous system.  Taking certain medicines, such as blood pressure medicine or diuretics.  Being pregnant. What are the signs or symptoms? Symptoms of this condition may include:  Weakness.  Light-headedness.  Dizziness.  Blurred vision.  Fatigue.  Rapid heartbeat.  Fainting, in severe cases. How is this diagnosed? This condition is diagnosed based on:  Your medical history.  Your symptoms.  Your blood pressure measurement. Your health care provider will check your blood pressure when you are: ? Lying down. ? Sitting. ? Standing. A blood pressure reading is recorded as two numbers, such as "120 over 80" (or 120/80). The first ("top") number is  called the systolic pressure. It is a measure of the pressure in your arteries as your heart beats. The second ("bottom") number is  called the diastolic pressure. It is a measure of the pressure in your arteries when your heart relaxes between beats. Blood pressure is measured in a unit called mm Hg. Healthy blood pressure for most adults is 120/80. If your blood pressure is below 90/60, you may be diagnosed with hypotension. Other information or tests that may be used to diagnose orthostatic hypotension include:  Your other vital signs, such as your heart rate and temperature.  Blood tests.  Tilt table test. For this test, you will be safely secured to a table that moves you from a lying position to an upright position. Your heart rhythm and blood pressure will be monitored during the test. How is this treated? This condition may be treated by:  Changing your diet. This may involve eating more salt (sodium) or drinking more water.  Taking medicines to raise your blood pressure.  Changing the dosage of certain medicines you are taking that might be lowering your blood pressure.  Wearing compression stockings. These stockings help to prevent blood clots and reduce swelling in your legs. In some cases, you may need to go to the hospital for:  Fluid replacement. This means you will receive fluids through an IV.  Blood replacement. This means you will receive donated blood through an IV (transfusion).  Treating an infection or heart problems, if this applies.  Monitoring. You may need to be monitored while medicines that you are taking wear off. Follow these instructions at home: Eating and drinking   Drink enough fluid to keep your urine pale yellow.  Eat a healthy diet, and follow instructions from your health care provider about eating or drinking restrictions. A healthy diet includes: ? Fresh fruits and vegetables. ? Whole grains. ? Lean meats. ? Low-fat dairy products.  Eat extra salt only as directed. Do not add extra salt to your diet unless your health care provider told you to do that.  Eat frequent,  small meals.  Avoid standing up suddenly after eating. Medicines  Take over-the-counter and prescription medicines only as told by your health care provider. ? Follow instructions from your health care provider about changing the dosage of your current medicines, if this applies. ? Do not stop or adjust any of your medicines on your own. General instructions   Wear compression stockings as told by your health care provider.  Get up slowly from lying down or sitting positions. This gives your blood pressure a chance to adjust.  Avoid hot showers and excessive heat as directed by your health care provider.  Return to your normal activities as told by your health care provider. Ask your health care provider what activities are safe for you.  Do not use any products that contain nicotine or tobacco, such as cigarettes, e-cigarettes, and chewing tobacco. If you need help quitting, ask your health care provider.  Keep all follow-up visits as told by your health care provider. This is important. Contact a health care provider if you:  Vomit.  Have diarrhea.  Have a fever for more than 2-3 days.  Feel more thirsty than usual.  Feel weak and tired. Get help right away if you:  Have chest pain.  Have a fast or irregular heartbeat.  Develop numbness in any part of your body.  Cannot move your arms or your legs.  Have trouble speaking.  Become sweaty  or feel light-headed.  Faint.  Feel short of breath.  Have trouble staying awake.  Feel confused. Summary  Orthostatic hypotension is a sudden drop in blood pressure that happens when you quickly change positions.  Orthostatic hypotension is usually not a serious problem.  It is diagnosed by having your blood pressure taken lying down, sitting, and then standing.  It may be treated by changing your diet or adjusting your medicines. This information is not intended to replace advice given to you by your health care  provider. Make sure you discuss any questions you have with your health care provider. Document Revised: 01/22/2018 Document Reviewed: 01/22/2018 Elsevier Patient Education  2020 [place] This Encounter  Procedures  . CBC  . COMPLETE METABOLIC PANEL WITH GFR  . Lipid panel  . TSH  . T4, free  . Fe+TIBC+Fer    No orders of the defined types were placed in this encounter.      Follow-up instructions: Return for RECHECK PENDING RESULTS / IF WORSE OR CHANGE. NEED TO GET ULTRASOUND!!!.                                         BP 129/76 (BP Location: Left Arm, Patient Position: Sitting, Cuff Size: Normal)   Pulse 71   Temp 97.9 F (36.6 C) (Oral)   Wt 136 lb 0.6 oz (61.7 kg)   BMI 22.64 kg/m   Current Meds  Medication Sig  . atorvastatin (LIPITOR) 40 MG tablet TAKE 1 TABLET BY MOUTH EVERY DAY  . ferrous sulfate 325 (65 FE) MG tablet Take 1 tablet (325 mg total) by mouth 2 (two) times daily with a meal.  . fluticasone (FLONASE) 50 MCG/ACT nasal spray 1 spray in each nostril daily  . gabapentin (NEURONTIN) 100 MG capsule Take 1 capsule (100 mg total) by mouth 3 (three) times daily.  . Influenza Vac Subunit Quad (FLUCELVAX QUADRIVALENT) 0.5 ML SUSY TO BE ADMINISTERED BY PHARMACIST FOR IMMUNIZATION  . lisinopril (ZESTRIL) 20 MG tablet Take 1 tablet (20 mg total) by mouth daily.  . meclizine (ANTIVERT) 25 MG tablet TAKE 1 TABLET BY MOUTH TWICE A DAY  . methocarbamol (ROBAXIN) 500 MG tablet Take by mouth.  . naltrexone (DEPADE) 50 MG tablet Take 2 tablets (100 mg total) by mouth daily.  . naproxen (NAPROSYN) 375 MG tablet TAKE 1 TABLET (375 MG TOTAL) BY MOUTH 2 TIMES DAILY WITH FOOD AS NEEDED (BACK PAIN.) NEED APPT  . sertraline (ZOLOFT) 100 MG tablet Take 100 mg by mouth daily.  . traZODone (DESYREL) 50 MG tablet Take 1 tablet (50 mg total) by mouth at bedtime.  . [DISCONTINUED] carvedilol (COREG) 25 MG tablet Take 1  tablet (25 mg total) by mouth 2 (two) times daily with a meal.    No results found for this or any previous visit (from the past 72 hour(s)).  No results found.  Depression screen [facility] 2/9 07/07/2019 04/21/2019 08/27/2018  Decreased Interest 1 0 1  Down, Depressed, Hopeless 1 0 1  PHQ - 2 Score 2 0 2  Altered sleeping 0 - 0  Tired, decreased energy 1 - 0  Change in appetite 1 - 0  Feeling bad or failure about yourself  1 - 0  Trouble concentrating 0 - 1  Moving slowly or fidgety/restless 0 - 0  Suicidal thoughts 0 - 0  PHQ-9  Score 5 - 3  Difficult doing work/chores Not difficult at all - Not difficult at all    GAD 7 : Generalized Anxiety Score 07/07/2019 04/21/2019 08/27/2018  Nervous, Anxious, on Edge 1 0 0  Control/stop worrying 0 0 0  Worry too much - different things 0 0 0  Trouble relaxing 0 0 0  Restless 0 1 0  Easily annoyed or irritable 1 0 0  Afraid - awful might happen 0 1 0  Total GAD 7 Score 2 2 0  Anxiety Difficulty Not difficult at all Not difficult at all -      All questions at time of visit were answered - patient instructed to contact office with any additional concerns or updates.  ER/RTC precautions were reviewed with the patient.  Please note: voice recognition software was used to produce this document, and typos may escape review. Please contact Dr. Lyn Hollingshead for any needed clarifications.

## 2020-02-23 ENCOUNTER — Telehealth: Payer: Self-pay

## 2020-02-23 DIAGNOSIS — I10 Essential (primary) hypertension: Secondary | ICD-10-CM

## 2020-02-23 NOTE — Telephone Encounter (Signed)
Returned call to patient's husband after message left on voicemail. Patient was just recently discharged from  [facility] on 02/14/2020 after a 30 day stay. Patient was advised to go to inpatient occupational therapy patient declined and wanted to go home. Husband is requesting referral to [facility] for physical therapy at home. Please advise.

## 2020-02-23 NOTE — Telephone Encounter (Signed)
Okay to place home health referral.  Request nursing, PT and OT.  Also make sure we get him scheduled for a hospital follow-up with Dr. Lyn Hollingshead.p

## 2020-02-24 NOTE — Telephone Encounter (Signed)
Please call patient to schedule hospital follow up, Per Dr. Linford Arnold.

## 2020-02-25 NOTE — Telephone Encounter (Signed)
Called patient today to schedule hospital f/u.  She has a PT appt coming up and will call us after she finds out when this appt is.  Thanks.

## 2020-03-21 ENCOUNTER — Ambulatory Visit (INDEPENDENT_AMBULATORY_CARE_PROVIDER_SITE_OTHER): Payer: 59 | Admitting: Osteopathic Medicine

## 2020-03-21 ENCOUNTER — Encounter: Payer: Self-pay | Admitting: Osteopathic Medicine

## 2020-03-21 VITALS — BP 128/79 | HR 86 | Wt 131.0 lb

## 2020-03-21 DIAGNOSIS — L578 Other skin changes due to chronic exposure to nonionizing radiation: Secondary | ICD-10-CM

## 2020-03-21 DIAGNOSIS — G8929 Other chronic pain: Secondary | ICD-10-CM

## 2020-03-21 DIAGNOSIS — R252 Cramp and spasm: Secondary | ICD-10-CM | POA: Diagnosis not present

## 2020-03-21 DIAGNOSIS — R945 Abnormal results of liver function studies: Secondary | ICD-10-CM

## 2020-03-21 DIAGNOSIS — S065X9A Traumatic subdural hemorrhage with loss of consciousness of unspecified duration, initial encounter: Secondary | ICD-10-CM | POA: Diagnosis not present

## 2020-03-21 DIAGNOSIS — S065XAA Traumatic subdural hemorrhage with loss of consciousness status unknown, initial encounter: Secondary | ICD-10-CM

## 2020-03-21 DIAGNOSIS — F1011 Alcohol abuse, in remission: Secondary | ICD-10-CM

## 2020-03-21 DIAGNOSIS — I5020 Unspecified systolic (congestive) heart failure: Secondary | ICD-10-CM

## 2020-03-21 DIAGNOSIS — M545 Low back pain, unspecified: Secondary | ICD-10-CM

## 2020-03-21 DIAGNOSIS — R7989 Other specified abnormal findings of blood chemistry: Secondary | ICD-10-CM

## 2020-03-21 MED ORDER — HYDROCODONE-ACETAMINOPHEN 5-325 MG PO TABS: 1.0000 | ORAL_TABLET | Freq: Three times a day (TID) | ORAL | 0 refills | Status: DC | PRN

## 2020-03-21 NOTE — Patient Instructions (Addendum)
Plan:  I think we can probably go up to Gabapentin from 100 mg to 200 mg at night, I'd like to go over this with neurology first since you're on the Keppra for the seizures. In the meantime, let's get blood work to make sure no concerning electrolyte imbalances due to new medications. I'd stop the over-the-counter leg cramp medication.   Skin spot frozen today. Keep this clean with mild soap and warm water, keep bandage over it as needed as it heals.

## 2020-03-21 NOTE — Progress Notes (Signed)
Michelle Hughes is a 65 y.o. female who presents to  [facility] at [facility]  today, 03/21/20, seeking care for the following: Hospital follow-up    Greatest complaint today is lower back pain, longstanding, requests pain Rx, states she'd make previous Hydrocodone Rx #30 last 2 years, just wants something to take as needd for severe symptoms since she can't take NSAID (see below). Also c/o leg cramps worse since Gabapentin was reduced in the hospital.   Records reviewed - treated for EtOH withdrawal seizures, subdural hematoma, stress cardiomyopathy. Following w/ neurosurgery and cardiology. Neurology referral placed, I don't see appointment upcoming.   Neurosurgery visit 03/09/2020: interval improvement in SDH on CT repeat. Referred for neurology evaluation. Refilled Keppra. NO aspirin, NSAID, etc. Follow CT in 1 month. Has f/u appt 04/10/2020  Cardiology visit 02/28/2020: "Possibly stress-induced cardiomyopathy but also known history of hypertension and significant ETOH use. Currently is abstaining from EtOH use. Volume status is stable. Will switch lisinopril to Entresto. Return in around 4 weeks for possible medication optimization and will need labs. Currently off her statin presumably due to her LFTs in the hospital. They were trending down." Has f/u appt 03/27/2020   Also has apot on skin on L leg, scaling, bothersome.     ASSESSMENT & PLAN with other pertinent findings:  The primary encounter diagnosis was Leg cramp. Diagnoses of Subdural hematoma (HCC), Alcohol abuse, in remission, Abnormal LFTs, Systolic congestive heart failure, unspecified HF chronicity (HCC), Chronic bilateral low back pain without sciatica, and Actinic skin damage were also pertinent to this visit.   Cryotherapy applied to area of actinic damage on L lower extremity (anterior thigh)   Patient Instructions  Plan:  I think we can probably go up to Gabapentin from  100 mg to 200 mg at night, I'd like to go over this with neurology first since you're on the Keppra for the seizures. In the meantime, let's get blood work to make sure no concerning electrolyte imbalances due to new medications. I'd stop the over-the-counter leg cramp medication.   Skin spot frozen today. Keep this clean with mild soap and warm water, keep bandage over it as needed as it heals.      Orders Placed This Encounter  Procedures  . COMPLETE METABOLIC PANEL WITH GFR  . Magnesium  . TSH  . CBC    Meds ordered this encounter  Medications  . HYDROcodone-acetaminophen (NORCO/VICODIN) 5-325 MG tablet    Sig: Take 1 tablet by mouth every 8 (eight) hours as needed for moderate pain.    Dispense:  10 tablet    Refill:  0       Follow-up instructions: Return for RECHECK PENDING RESULTS / IF WORSE OR CHANGE.                                         BP 128/79 (BP Location: Left Arm, Patient Position: Sitting)   Pulse 86   Wt 131 lb (59.4 kg)   SpO2 99%   BMI 21.80 kg/m   Current Meds  Medication Sig  . atorvastatin (LIPITOR) 40 MG tablet TAKE 1 TABLET BY MOUTH EVERY DAY  . carvedilol (COREG) 25 MG tablet Take by mouth.  . folic acid (FOLVITE) 1 MG tablet Take by mouth.  . furosemide (LASIX) 20 MG tablet Take by mouth.  . gabapentin (NEURONTIN) 100 MG  capsule Take 1 capsule (100 mg total) by mouth 3 (three) times daily.  Marland Kitchen levETIRAcetam (KEPPRA) 500 MG tablet Take by mouth.  . Multiple Vitamin (TAB-A-VITE) TABS Take 1 tablet by mouth daily.  . sacubitril-valsartan (ENTRESTO) 24-26 MG Take by mouth.  . sertraline (ZOLOFT) 100 MG tablet Take 100 mg by mouth daily.  Marland Kitchen thiamine 100 MG tablet Take 100 mg by mouth daily.  . traZODone (DESYREL) 50 MG tablet Take 1 tablet (50 mg total) by mouth at bedtime.  . [DISCONTINUED] Influenza Vac Subunit Quad (FLUCELVAX QUADRIVALENT) 0.5 ML SUSY TO BE ADMINISTERED BY PHARMACIST FOR IMMUNIZATION  .  [DISCONTINUED] lisinopril (ZESTRIL) 10 MG tablet Take 10 mg by mouth daily.  . [DISCONTINUED] lisinopril (ZESTRIL) 20 MG tablet Take 1 tablet (20 mg total) by mouth daily.  . [DISCONTINUED] meclizine (ANTIVERT) 25 MG tablet TAKE 1 TABLET BY MOUTH TWICE A DAY  . [DISCONTINUED] naproxen (NAPROSYN) 375 MG tablet TAKE 1 TABLET (375 MG TOTAL) BY MOUTH 2 TIMES DAILY WITH FOOD AS NEEDED (BACK PAIN.) NEED APPT  . [DISCONTINUED] thiamine 100 MG tablet Take by mouth.    No results found for this or any previous visit (from the past 72 hour(s)).  No results found.     All questions at time of visit were answered - patient instructed to contact office with any additional concerns or updates.  ER/RTC precautions were reviewed with the patient as applicable.   Please note: voice recognition software was used to produce this document, and typos may escape review. Please contact Dr. Lyn Hollingshead for any needed clarifications.

## 2020-03-22 ENCOUNTER — Encounter: Payer: Self-pay | Admitting: Osteopathic Medicine

## 2020-03-22 LAB — COMPLETE METABOLIC PANEL WITH GFR
AG Ratio: 1.2 (calc) (ref 1.0–2.5)
ALT: 11 U/L (ref 6–29)
AST: 27 U/L (ref 10–35)
Albumin: 3.8 g/dL (ref 3.6–5.1)
Alkaline phosphatase (APISO): 78 U/L (ref 37–153)
BUN: 13 mg/dL (ref 7–25)
CO2: 27 mmol/L (ref 20–32)
Calcium: 9.6 mg/dL (ref 8.6–10.4)
Chloride: 98 mmol/L (ref 98–110)
Creat: 0.61 mg/dL (ref 0.50–0.99)
GFR, Est African American: 112 mL/min/{1.73_m2} (ref >=60)
GFR, Est Non African American: 96 mL/min/{1.73_m2} (ref >=60)
Globulin: 3.3 g/dL (calc) (ref 1.9–3.7)
Glucose, Bld: 90 mg/dL (ref 65–99)
Potassium: 4.1 mmol/L (ref 3.5–5.3)
Sodium: 135 mmol/L (ref 135–146)
Total Bilirubin: 0.4 mg/dL (ref 0.2–1.2)
Total Protein: 7.1 g/dL (ref 6.1–8.1)

## 2020-03-22 LAB — CBC
HCT: 39.4 % (ref 35.0–45.0)
Hemoglobin: 12.8 g/dL (ref 11.7–15.5)
MCH: 30.2 pg (ref 27.0–33.0)
MCHC: 32.5 g/dL (ref 32.0–36.0)
MCV: 92.9 fL (ref 80.0–100.0)
MPV: 11.1 fL (ref 7.5–12.5)
Platelets: 451 10*3/uL — ABNORMAL HIGH (ref 140–400)
RBC: 4.24 10*6/uL (ref 3.80–5.10)
RDW: 14.2 % (ref 11.0–15.0)
WBC: 10.2 10*3/uL (ref 3.8–10.8)

## 2020-03-22 LAB — MAGNESIUM: Magnesium: 1.4 mg/dL — ABNORMAL LOW (ref 1.5–2.5)

## 2020-03-22 LAB — TSH: TSH: 1.54 mIU/L (ref 0.40–4.50)

## 2020-03-25 ENCOUNTER — Other Ambulatory Visit: Payer: Self-pay | Admitting: Osteopathic Medicine

## 2020-03-25 DIAGNOSIS — I10 Essential (primary) hypertension: Secondary | ICD-10-CM

## 2020-03-28 NOTE — Telephone Encounter (Signed)
Pt was seen by [facility] cardiology yesterday.  Does cardiology need to be managing this medication?  Please review and refill if appropriate.  Tiajuana Amass, CMA

## 2020-04-10 ENCOUNTER — Other Ambulatory Visit: Payer: Self-pay

## 2020-04-11 NOTE — Telephone Encounter (Signed)
Prescribed for acute/rare use 03/21/2020 pt stated she can make #30 pills last over a year - no refills without visit to discuss other options bedsides chronic opiate Rx

## 2020-04-12 ENCOUNTER — Other Ambulatory Visit: Payer: Self-pay

## 2020-04-12 DIAGNOSIS — M51369 Other intervertebral disc degeneration, lumbar region without mention of lumbar back pain or lower extremity pain: Secondary | ICD-10-CM

## 2020-04-12 DIAGNOSIS — M5136 Other intervertebral disc degeneration, lumbar region: Secondary | ICD-10-CM

## 2020-04-12 NOTE — Telephone Encounter (Signed)
Medication refill  Last refill 03/21/2020 Last ov- 03/21/2020

## 2020-04-13 ENCOUNTER — Telehealth: Payer: Self-pay

## 2020-04-13 NOTE — Telephone Encounter (Signed)
Patient contacted, patient advised. Please see last encounter. Patient says she will need to find a ride to the appointment before she can make an appointment. She has had two seizures and cannot drive at the moment.

## 2020-04-13 NOTE — Telephone Encounter (Signed)
Patient says she is waiting on an appointment to see the  Neurologist. Patient is requesting short term opoid mediation until she can be seen in pain management.  Please advise

## 2020-04-13 NOTE — Telephone Encounter (Signed)
Legally per [place] law and per [facility] policy, I cannot refill opiates beyond short-term (5 days) Rx without repeat visit to discuss pain management. At that appointment, I will decide IF I am ok to continue opiates or if there are other options we should explore.   Not my call, [place] law and [facility] policy

## 2020-04-26 ENCOUNTER — Encounter: Payer: Self-pay | Admitting: Osteopathic Medicine

## 2020-04-26 ENCOUNTER — Ambulatory Visit (INDEPENDENT_AMBULATORY_CARE_PROVIDER_SITE_OTHER): Payer: 59 | Admitting: Osteopathic Medicine

## 2020-04-26 VITALS — BP 115/66 | HR 66 | Wt 133.0 lb

## 2020-04-26 DIAGNOSIS — M545 Low back pain, unspecified: Secondary | ICD-10-CM

## 2020-04-26 DIAGNOSIS — G8929 Other chronic pain: Secondary | ICD-10-CM | POA: Diagnosis not present

## 2020-04-26 MED ORDER — HYDROCODONE-ACETAMINOPHEN 5-325 MG PO TABS: 1.0000 | ORAL_TABLET | Freq: Four times a day (QID) | ORAL | 0 refills | Status: AC | PRN

## 2020-04-26 NOTE — Progress Notes (Signed)
Michelle Hughes is a 64 y.o. female who presents to  [facility] Primary [facility] Medicine at [facility]  today, 04/26/20, seeking care for the following:  . Hip and lower back pain, upcoming appointment with pain management      ASSESSMENT & PLAN with other pertinent findings:  The encounter diagnosis was Chronic bilateral low back pain without sciatica.   No results found for this or any previous visit (from the past 24 hour(s)).  Short term refill opiate pain medications     Patient Instructions  Filled Rx for #30 tablets, this needs to last you until follow-up with pain management on 06/05/20.       No orders of the defined types were placed in this encounter.   Meds ordered this encounter  Medications  . HYDROcodone-acetaminophen (NORCO/VICODIN) 5-325 MG tablet    Sig: Take 1 tablet by mouth every 6 (six) hours as needed for moderate pain.    Dispense:  30 tablet    Refill:  0       Follow-up instructions: Return in about 6 months (around 10/24/2020) for ANNUAL (call week prior to visit for lab orders), SEE ME SOONER AS NEEDED.                                         BP 115/66 (BP Location: Right Arm, Patient Position: Sitting)   Pulse 66   Wt 133 lb (60.3 kg)   SpO2 98%   BMI 22.13 kg/m   Current Meds  Medication Sig  . atorvastatin (LIPITOR) 40 MG tablet TAKE 1 TABLET BY MOUTH EVERY DAY  . carvedilol (COREG) 25 MG tablet TAKE 1 TABLET (25 MG TOTAL) BY MOUTH 2 (TWO) TIMES DAILY WITH A MEAL.  . folic acid (FOLVITE) 1 MG tablet Take by mouth.  . furosemide (LASIX) 20 MG tablet Take by mouth.  . gabapentin (NEURONTIN) 100 MG capsule Take 1 capsule (100 mg total) by mouth 3 (three) times daily.  Marland Kitchen HYDROcodone-acetaminophen (NORCO/VICODIN) 5-325 MG tablet Take 1 tablet by mouth every 6 (six) hours as needed for moderate pain.  Marland Kitchen levETIRAcetam (KEPPRA) 500 MG tablet Take by mouth.  . Magnesium 200 MG  TABS Take by mouth.  . Multiple Vitamin (TAB-A-VITE) TABS Take 1 tablet by mouth daily.  . sacubitril-valsartan (ENTRESTO) 24-26 MG Take by mouth.  . sertraline (ZOLOFT) 100 MG tablet Take 100 mg by mouth daily.  Marland Kitchen spironolactone (ALDACTONE) 25 MG tablet Take by mouth.  . thiamine 100 MG tablet Take 100 mg by mouth daily.  . traZODone (DESYREL) 50 MG tablet Take 1 tablet (50 mg total) by mouth at bedtime.  . [DISCONTINUED] HYDROcodone-acetaminophen (NORCO/VICODIN) 5-325 MG tablet Take 1 tablet by mouth every 8 (eight) hours as needed for moderate pain.    No results found for this or any previous visit (from the past 72 hour(s)).  No results found.     All questions at time of visit were answered - patient instructed to contact office with any additional concerns or updates.  ER/RTC precautions were reviewed with the patient as applicable.   Please note: voice recognition software was used to produce this document, and typos may escape review. Please contact Dr. Lyn Hollingshead for any needed clarifications.   Total encounter time: 20 minutes.

## 2020-04-26 NOTE — Patient Instructions (Signed)
Filled Rx for #30 tablets, this needs to last you until follow-up with pain management on 06/05/20.

## 2020-04-27 ENCOUNTER — Telehealth: Payer: Self-pay

## 2020-04-27 NOTE — Telephone Encounter (Signed)
Patient has a dental appointment on 05/09/2020. The dentist is requiring medical clearance before the appointment. What information should be printed and faxed to the dentist office.

## 2020-04-27 NOTE — Telephone Encounter (Signed)
Dentist needs to send me any requirements, I need to know if office procedure or anesthesi /outpatient surgery or what's being done. Patient needs appt (can be with NP f i'm not available)

## 2020-04-28 NOTE — Telephone Encounter (Signed)
Left message on voicemail, for patient to return call to the office to let us know what services she will be receiving at the dentist office for medical clearance.

## 2020-05-03 ENCOUNTER — Telehealth: Payer: Self-pay

## 2020-05-03 NOTE — Telephone Encounter (Signed)
First attempt to contact dentist office. No voicemail available for me to leave message. It says that all lines are in use.

## 2020-05-03 NOTE — Telephone Encounter (Signed)
Fransico Michael, DDS, office called to find out if patient needs pre-medication before dental cleaning. Also states she has had a seizure with a head injury. They wanted to know if she was ok lying back for the whole visit for the cleaning. Please advise.    Fransico Michael, [phone]

## 2020-05-03 NOTE — Telephone Encounter (Signed)
No need for premedication OK to lie down as usual

## 2020-05-04 NOTE — Telephone Encounter (Signed)
Dr Collier Bullock called back wanting a letter faxed stating clearance for dental procedure and ok to lie down during procedure. Is it ok to write?

## 2020-05-04 NOTE — Telephone Encounter (Signed)
Office advised.

## 2020-05-04 NOTE — Telephone Encounter (Signed)
Ok to provide letter with Dr. Mardelle Matte recommendations.

## 2020-05-05 ENCOUNTER — Encounter: Payer: Self-pay | Admitting: Osteopathic Medicine

## 2020-05-05 NOTE — Telephone Encounter (Signed)
Written and faxed.  

## 2020-07-05 ENCOUNTER — Other Ambulatory Visit: Payer: Self-pay | Admitting: Osteopathic Medicine

## 2020-07-26 ENCOUNTER — Other Ambulatory Visit: Payer: Self-pay | Admitting: Osteopathic Medicine

## 2020-08-11 ENCOUNTER — Other Ambulatory Visit: Payer: Self-pay | Admitting: Osteopathic Medicine

## 2020-08-25 DIAGNOSIS — F1020 Alcohol dependence, uncomplicated: Secondary | ICD-10-CM | POA: Diagnosis not present

## 2020-08-25 DIAGNOSIS — F411 Generalized anxiety disorder: Secondary | ICD-10-CM | POA: Diagnosis not present

## 2020-08-25 DIAGNOSIS — F331 Major depressive disorder, recurrent, moderate: Secondary | ICD-10-CM | POA: Diagnosis not present

## 2020-08-31 DIAGNOSIS — M47816 Spondylosis without myelopathy or radiculopathy, lumbar region: Secondary | ICD-10-CM | POA: Diagnosis not present

## 2020-10-07 IMAGING — MG DIGITAL SCREENING BILAT W/ TOMO W/ CAD
8 series · 8 of 24 positions shown · non-contrast
Comparison: Previous exam(s).

CLINICAL DATA: Screening.

EXAM:
DIGITAL SCREENING BILATERAL MAMMOGRAM WITH TOMO AND CAD

[R CC synth-2D]
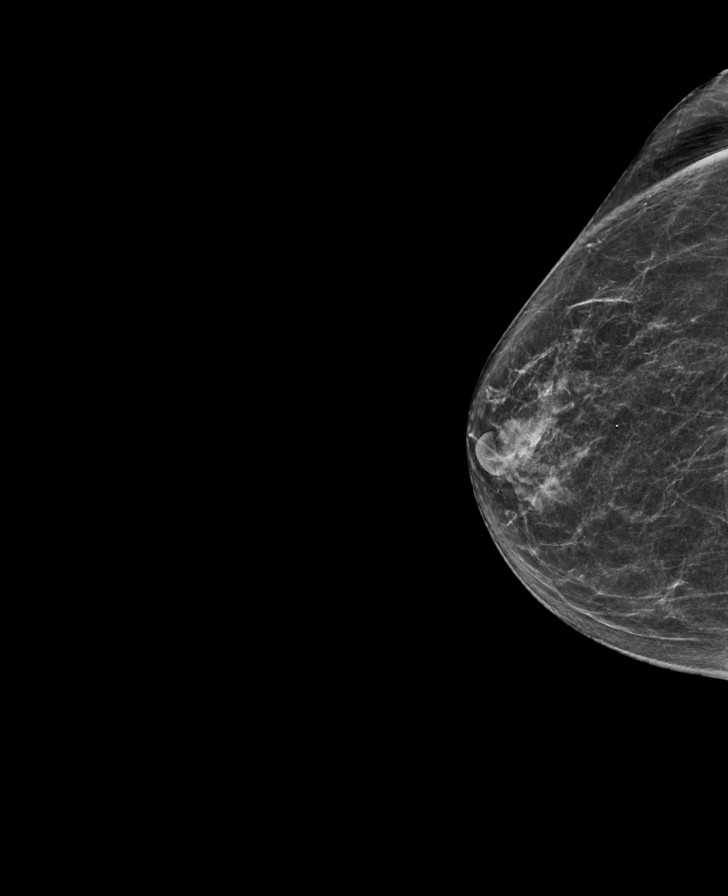

[L MLO synth-2D]
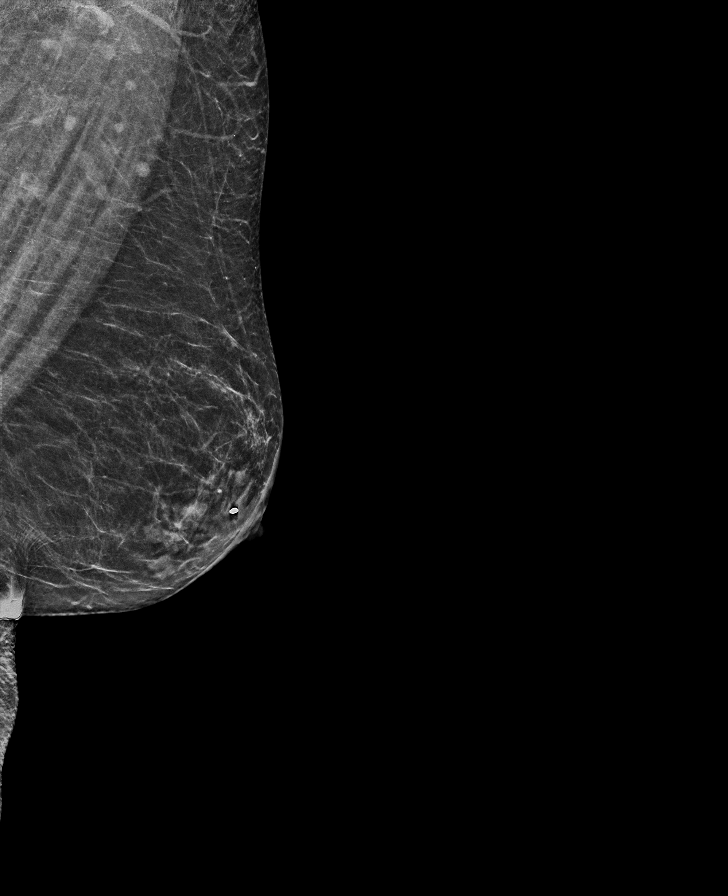

[R MLO synth-2D]
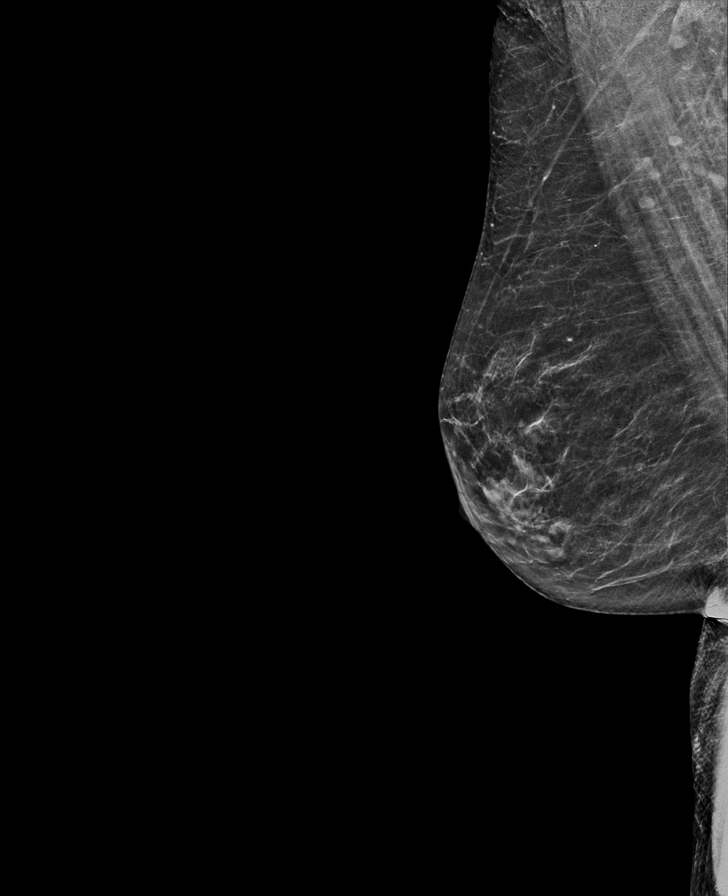

[L CC synth-2D]
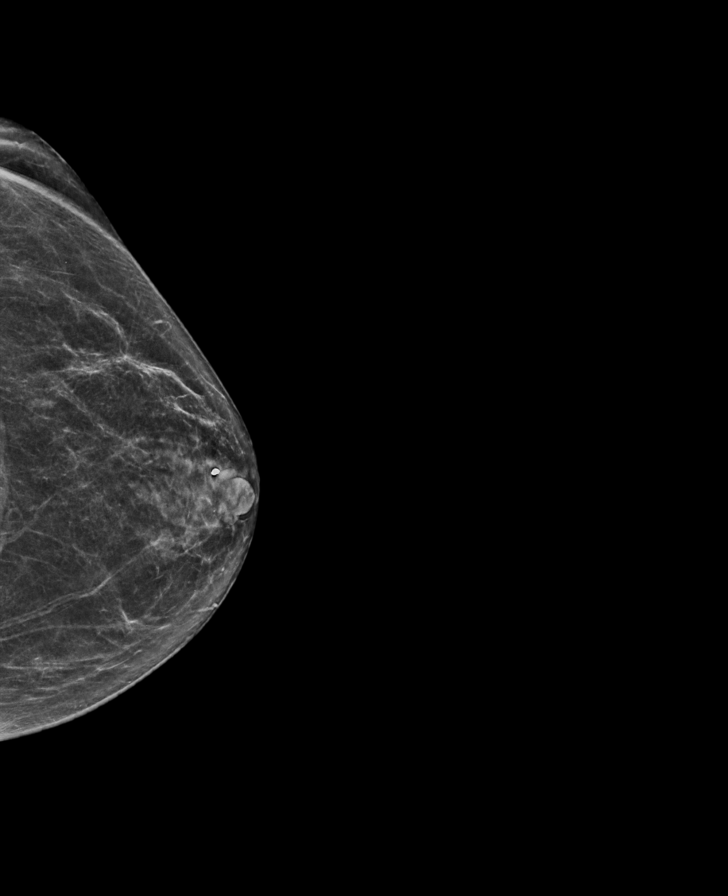

[R CC tomo · tomo slice 30/59.0]
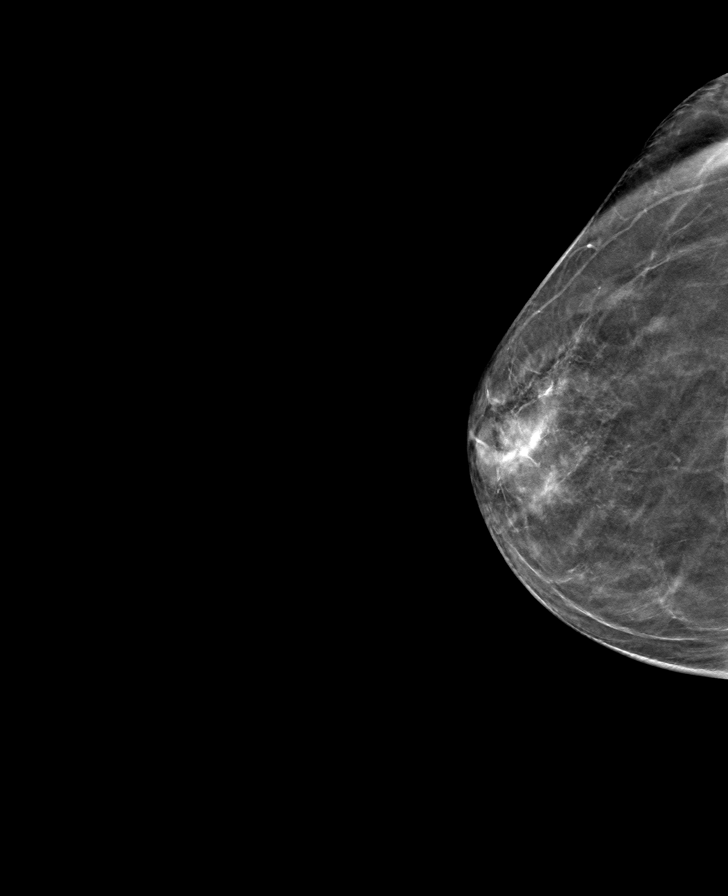

[L MLO tomo · tomo slice 29/58.0]
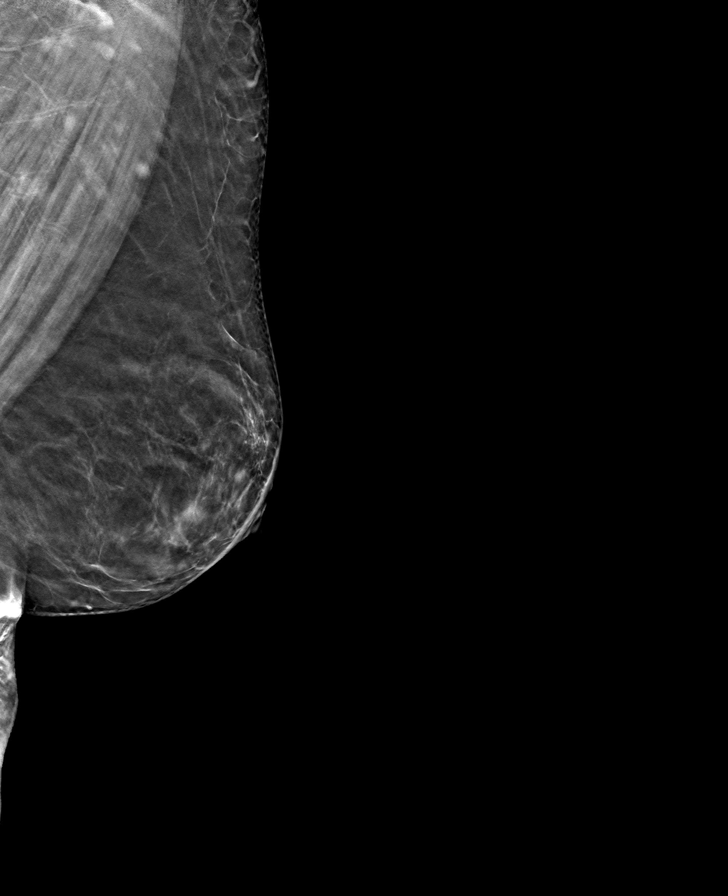

[L CC tomo · tomo slice 30/59.0]
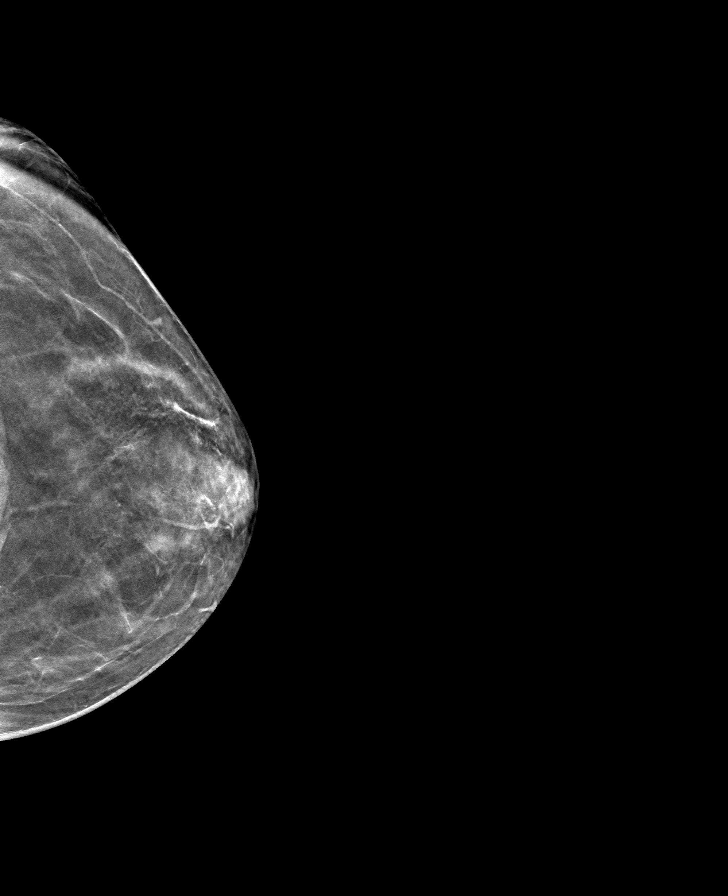

[R MLO tomo · tomo slice 29/58.0]
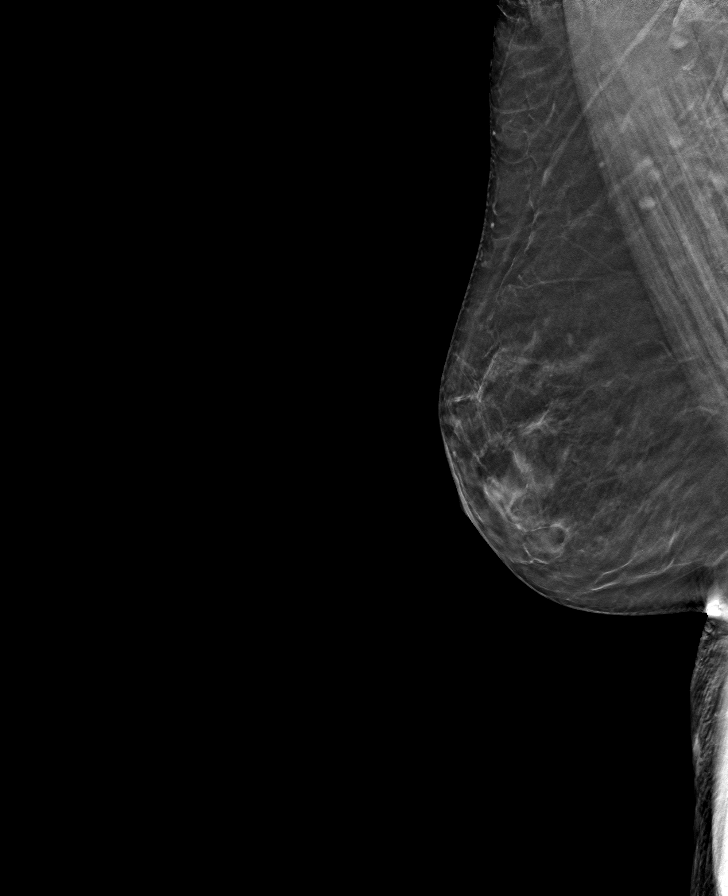

[8 of 24 positions shown; findings below may reference images not displayed]

ACR Breast Density Category b: There are scattered areas of
fibroglandular density.
FINDINGS: There are no findings suspicious for malignancy. Images were
processed with CAD.
IMPRESSION: No mammographic evidence of malignancy. A result letter of this
screening mammogram will be mailed directly to the patient.

RECOMMENDATION:
Screening mammogram in one year. (Code:[id number])

BI-RADS CATEGORY  1: Negative.

## 2020-10-26 DIAGNOSIS — F331 Major depressive disorder, recurrent, moderate: Secondary | ICD-10-CM | POA: Diagnosis not present

## 2020-10-26 DIAGNOSIS — E782 Mixed hyperlipidemia: Secondary | ICD-10-CM | POA: Diagnosis not present

## 2020-10-26 DIAGNOSIS — F1010 Alcohol abuse, uncomplicated: Secondary | ICD-10-CM | POA: Diagnosis not present

## 2020-10-26 DIAGNOSIS — I429 Cardiomyopathy, unspecified: Secondary | ICD-10-CM | POA: Diagnosis not present

## 2020-10-26 DIAGNOSIS — I10 Essential (primary) hypertension: Secondary | ICD-10-CM | POA: Diagnosis not present

## 2020-10-26 DIAGNOSIS — Z1231 Encounter for screening mammogram for malignant neoplasm of breast: Secondary | ICD-10-CM | POA: Diagnosis not present

## 2022-09-16 ENCOUNTER — Telehealth (HOSPITAL_BASED_OUTPATIENT_CLINIC_OR_DEPARTMENT_OTHER): Payer: Self-pay

## 2022-09-16 NOTE — Telephone Encounter (Signed)
Transition Care Management Unsuccessful Follow-up Telephone Call  Date of discharge and from where:  [facility] 09-15-22 Dx: acquired ab cense of lung   Attempts:  1st Attempt  Reason for unsuccessful TCM follow-up call:  Left voice message   Juanda Crumble LPN [place] Direct Dial [phone]

## 2022-09-17 NOTE — Telephone Encounter (Signed)
Transition Care Management Unsuccessful Follow-up Telephone Call  Date of discharge and from where:    [facility] 09-15-22 Dx: acquired ab cense of lung     Attempts:  2nd Attempt  Reason for unsuccessful TCM follow-up call:  Left voice message   Juanda Crumble LPN [place] Direct Dial [phone]
